# Patient Record
Sex: Female | Born: 1995 | Race: White | Hispanic: No | Marital: Married | Smoking: Never smoker
Health system: Southern US, Community
[De-identification: ages and names within clinical notes are randomized; demographics above are authoritative.]

## PROBLEM LIST (undated history)

## (undated) DIAGNOSIS — Z789 Other specified health status: Secondary | ICD-10-CM

## (undated) DIAGNOSIS — Z30430 Encounter for insertion of intrauterine contraceptive device: Secondary | ICD-10-CM

## (undated) HISTORY — PX: OTHER SURGICAL HISTORY: SHX169

## (undated) HISTORY — DX: Encounter for insertion of intrauterine contraceptive device: Z30.430

---

## 2020-04-01 NOTE — L&D Delivery Note (Signed)
OB/GYN Faculty Practice Delivery Note  Nicole Morse is a 25 y.o. [id number] s/p NSVD at [redacted]w[redacted]d. She was admitted for IOL due to post dates.   ROM: 3h 7m with clear fluid GBS Status: Negative   Delivery Date/Time: 12/29/20 at 1617  Delivery: Called to room and patient was complete and pushing. Head delivered LOA. Nuchal cord present and easily reduced at the perineum. Shoulder and body delivered in usual fashion. Infant with spontaneous cry, placed on mother's abdomen, dried and stimulated. Cord clamped x 2 after 1-minute delay, and cut by FOB under direct supervision. Cord blood drawn. Placenta delivered spontaneously with gentle cord traction. Fundus firm with massage and Pitocin. Labia, perineum, vagina, and cervix were inspected, and patient was found to have no lacerations.   Placenta: Intact, 3VC - sent to L&D Complications: None Lacerations: None  EBL: 125 cc Analgesia: Epidural   Infant: Viable female  APGARs 9 and 9  Post-Placental IUD Insertion Procedure Note  Patient identified, informed consent signed prior to delivery, signed copy in chart, time out was performed.    Vaginal, labial and perineal areas thoroughly inspected for lacerations. No lacerations were noted.   - IUD grasped between sterile gloved fingers. Fundus identified through abdominal wall using non-insertion hand. IUD inserted to fundus with bimanual technique. IUD carefully released at the fundus and insertion hand gently removed from vagina.    Strings trimmed to the level of the introitus. Patient tolerated procedure well.  Lot # [id number] Expiration Date: 12/31/2023  Patient given post procedure instructions and IUD care card with expiration date.  Patient was asked to keep IUD strings tucked in her vagina until her postpartum follow up visit in 4-6 weeks. Patient advised to abstain from sexual intercourse and pulling on strings before her follow-up visit. Patient verbalized an understanding of the plan  of care and agrees.   [name], MD OB/GYN Fellow, Faculty Practice

## 2020-04-04 ENCOUNTER — Ambulatory Visit (INDEPENDENT_AMBULATORY_CARE_PROVIDER_SITE_OTHER): Payer: Medicaid Other | Admitting: Family Medicine

## 2020-04-04 ENCOUNTER — Other Ambulatory Visit: Payer: Self-pay

## 2020-04-04 ENCOUNTER — Other Ambulatory Visit: Payer: Self-pay | Admitting: Family Medicine

## 2020-04-04 ENCOUNTER — Encounter: Payer: Self-pay | Admitting: Family Medicine

## 2020-04-04 VITALS — BP 98/66 | HR 92 | Ht 65.5 in | Wt 133.4 lb

## 2020-04-04 DIAGNOSIS — Z0289 Encounter for other administrative examinations: Secondary | ICD-10-CM | POA: Diagnosis not present

## 2020-04-04 DIAGNOSIS — K0889 Other specified disorders of teeth and supporting structures: Secondary | ICD-10-CM | POA: Diagnosis not present

## 2020-04-04 LAB — POCT URINE PREGNANCY: Preg Test, Ur: NEGATIVE

## 2020-04-04 MED ORDER — BENZOCAINE 10 % MT GEL: 1.0000 "application " | Freq: Three times a day (TID) | OROMUCOSAL | 3 refills | Status: DC | PRN

## 2020-04-04 MED ORDER — IVERMECTIN 3 MG PO TABS: 200.0000 ug/kg | ORAL_TABLET | Freq: Every day | ORAL | 0 refills | Status: DC

## 2020-04-04 MED ORDER — ALBENDAZOLE 200 MG PO TABS: 400.0000 mg | ORAL_TABLET | Freq: Once | ORAL | 0 refills | Status: AC

## 2020-04-04 NOTE — Patient Instructions (Signed)
It was wonderful to see you today.  Please bring ALL of your medications with you to every visit.   Today we talked about:  ---Going to the lab    Thank you for choosing [facility].   Please call [phone] with any questions about today's appointment.  Please be sure to schedule follow up at the front  desk before you leave today.   Terisa Starr, MD  Family Medicine
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
## 2020-04-04 NOTE — Assessment & Plan Note (Addendum)
Routine labs today, including UA and urine pregnancy test (negative).  Follow-up appointment for health concerns on April 17, 2020.  No vaccines indicated today. Resources offered for mental health concerns today, patient declines at this time.

## 2020-04-05 LAB — TSH: TSH: 0.698 u[IU]/mL (ref 0.450–4.500)

## 2020-04-05 LAB — HEPATITIS B CORE ANTIBODY, TOTAL: Hep B Core Total Ab: NEGATIVE

## 2020-04-05 LAB — HIV ANTIBODY (ROUTINE TESTING W REFLEX): HIV Screen 4th Generation wRfx: NONREACTIVE

## 2020-04-05 LAB — COMPREHENSIVE METABOLIC PANEL
ALT: 25 IU/L (ref 0–32)
AST: 18 IU/L (ref 0–40)
Albumin/Globulin Ratio: 1.6 (ref 1.2–2.2)
Albumin: 4.6 g/dL (ref 3.9–5.0)
Alkaline Phosphatase: 62 IU/L (ref 44–121)
BUN/Creatinine Ratio: 24 — ABNORMAL HIGH (ref 9–23)
BUN: 12 mg/dL (ref 6–20)
Bilirubin Total: 0.3 mg/dL (ref 0.0–1.2)
CO2: 21 mmol/L (ref 20–29)
Calcium: 9.5 mg/dL (ref 8.7–10.2)
Chloride: 103 mmol/L (ref 96–106)
Creatinine, Ser: 0.49 mg/dL — ABNORMAL LOW (ref 0.57–1.00)
GFR calc Af Amer: 157 mL/min/{1.73_m2} (ref >59)
GFR calc non Af Amer: 136 mL/min/{1.73_m2} (ref >59)
Globulin, Total: 2.8 g/dL (ref 1.5–4.5)
Glucose: 87 mg/dL (ref 65–99)
Potassium: 4.3 mmol/L (ref 3.5–5.2)
Sodium: 137 mmol/L (ref 134–144)
Total Protein: 7.4 g/dL (ref 6.0–8.5)

## 2020-04-05 LAB — CBC WITH DIFFERENTIAL/PLATELET
Basophils Absolute: 0 10*3/uL (ref 0.0–0.2)
Basos: 1 %
EOS (ABSOLUTE): 0.4 10*3/uL (ref 0.0–0.4)
Eos: 5 %
Hematocrit: 38.8 % (ref 34.0–46.6)
Hemoglobin: 13.5 g/dL (ref 11.1–15.9)
Immature Grans (Abs): 0 10*3/uL (ref 0.0–0.1)
Immature Granulocytes: 0 %
Lymphocytes Absolute: 3.2 10*3/uL — ABNORMAL HIGH (ref 0.7–3.1)
Lymphs: 38 %
MCH: 30.1 pg (ref 26.6–33.0)
MCHC: 34.8 g/dL (ref 31.5–35.7)
MCV: 87 fL (ref 79–97)
Monocytes Absolute: 0.7 10*3/uL (ref 0.1–0.9)
Monocytes: 8 %
Neutrophils Absolute: 4.2 10*3/uL (ref 1.4–7.0)
Neutrophils: 48 %
Platelets: 217 10*3/uL (ref 150–450)
RBC: 4.48 x10E6/uL (ref 3.77–5.28)
RDW: 11.8 % (ref 11.7–15.4)
WBC: 8.6 10*3/uL (ref 3.4–10.8)

## 2020-04-05 LAB — LIPID PANEL
Chol/HDL Ratio: 3.3 ratio (ref 0.0–4.4)
Cholesterol, Total: 193 mg/dL (ref 100–199)
HDL: 58 mg/dL (ref >39)
LDL Chol Calc (NIH): 115 mg/dL — ABNORMAL HIGH (ref 0–99)
Triglycerides: 113 mg/dL (ref 0–149)
VLDL Cholesterol Cal: 20 mg/dL (ref 5–40)

## 2020-04-05 LAB — RPR: RPR Ser Ql: NONREACTIVE

## 2020-04-05 LAB — HCV INTERPRETATION

## 2020-04-05 LAB — VARICELLA ZOSTER ANTIBODY, IGG: Varicella zoster IgG: 1191 index (ref >165)

## 2020-04-05 LAB — HCV AB W REFLEX TO QUANT PCR: HCV Ab: <0.1 s/co ratio (ref 0.0–0.9)

## 2020-04-05 LAB — HEPATITIS B SURFACE ANTIBODY, QUANTITATIVE: Hepatitis B Surf Ab Quant: <3.1 m[IU]/mL — ABNORMAL LOW (ref >9.9)

## 2020-04-05 LAB — HEPATITIS B SURFACE ANTIGEN: Hepatitis B Surface Ag: NEGATIVE

## 2020-04-06 LAB — HGB FRACTIONATION CASCADE
Hgb A2: 2.8 % (ref 1.8–3.2)
Hgb A: 97.2 % (ref 96.4–98.8)
Hgb F: 0 % (ref 0.0–2.0)
Hgb S: 0 %

## 2020-04-07 ENCOUNTER — Ambulatory Visit: Payer: Medicaid Other | Admitting: Family Medicine

## 2020-04-07 ENCOUNTER — Telehealth: Payer: Self-pay | Admitting: Family Medicine

## 2020-04-07 NOTE — Telephone Encounter (Signed)
Attempted to call X1. No Pashto interpreter available. Will try again next week.  Terisa Starr, MD  Family Medicine Teaching Service

## 2020-04-12 ENCOUNTER — Encounter: Payer: Self-pay | Admitting: Family Medicine

## 2020-04-17 ENCOUNTER — Ambulatory Visit: Payer: Medicaid Other | Admitting: Family Medicine

## 2020-04-21 ENCOUNTER — Other Ambulatory Visit: Payer: Self-pay

## 2020-04-21 ENCOUNTER — Other Ambulatory Visit (HOSPITAL_COMMUNITY)
Admission: RE | Admit: 2020-04-21 | Discharge: 2020-04-21 | Disposition: A | Discharge status: Home / Self Care | Payer: Medicaid Other | Source: Ambulatory Visit | Attending: Family Medicine | Admitting: Family Medicine

## 2020-04-21 ENCOUNTER — Other Ambulatory Visit (HOSPITAL_COMMUNITY): Payer: Self-pay | Admitting: Family Medicine

## 2020-04-21 ENCOUNTER — Ambulatory Visit (INDEPENDENT_AMBULATORY_CARE_PROVIDER_SITE_OTHER): Payer: Medicaid Other | Admitting: Family Medicine

## 2020-04-21 VITALS — BP 98/60 | HR 98 | Ht 65.5 in | Wt 136.2 lb

## 2020-04-21 DIAGNOSIS — Z3200 Encounter for pregnancy test, result unknown: Secondary | ICD-10-CM | POA: Diagnosis not present

## 2020-04-21 DIAGNOSIS — R109 Unspecified abdominal pain: Secondary | ICD-10-CM | POA: Diagnosis not present

## 2020-04-21 DIAGNOSIS — R1033 Periumbilical pain: Secondary | ICD-10-CM

## 2020-04-21 DIAGNOSIS — K0889 Other specified disorders of teeth and supporting structures: Secondary | ICD-10-CM | POA: Insufficient documentation

## 2020-04-21 DIAGNOSIS — Z124 Encounter for screening for malignant neoplasm of cervix: Secondary | ICD-10-CM | POA: Insufficient documentation

## 2020-04-21 DIAGNOSIS — Z3490 Encounter for supervision of normal pregnancy, unspecified, unspecified trimester: Secondary | ICD-10-CM

## 2020-04-21 DIAGNOSIS — N898 Other specified noninflammatory disorders of vagina: Secondary | ICD-10-CM

## 2020-04-21 DIAGNOSIS — Z0000 Encounter for general adult medical examination without abnormal findings: Secondary | ICD-10-CM

## 2020-04-21 DIAGNOSIS — Z01419 Encounter for gynecological examination (general) (routine) without abnormal findings: Secondary | ICD-10-CM | POA: Diagnosis not present

## 2020-04-21 LAB — POCT URINALYSIS DIP (CLINITEK)
Bilirubin, UA: NEGATIVE
Blood, UA: NEGATIVE
Glucose, UA: NEGATIVE mg/dL
Ketones, POC UA: NEGATIVE mg/dL
Leukocytes, UA: NEGATIVE
Nitrite, UA: NEGATIVE
POC PROTEIN,UA: NEGATIVE
Spec Grav, UA: 1.025 (ref 1.010–1.025)
Urobilinogen, UA: 0.2 E.U./dL
pH, UA: 6 (ref 5.0–8.0)

## 2020-04-21 LAB — POCT WET PREP (WET MOUNT)
Clue Cells Wet Prep Whiff POC: NEGATIVE
Trichomonas Wet Prep HPF POC: ABSENT

## 2020-04-21 LAB — POCT URINE PREGNANCY: Preg Test, Ur: POSITIVE — AB

## 2020-04-21 MED ORDER — PRENATAL + COMPLETE MULTI 0.267 & 373 MG PO THPK: 1.0000 | PACK | Freq: Every day | ORAL | 11 refills | Status: DC

## 2020-04-21 NOTE — Assessment & Plan Note (Signed)
Patient with no h/o renal problems, will obtain UA an BMP for reassurance as patient requests this despite counseling. Suspect physiologic aches/pains as patient is busy mother of 4 and mostly pain occurs when picking up 31 month old infant. Less likely to be renal source given bilateral hx. - UA reassuring, no abnormality - f/u BMP

## 2020-04-21 NOTE — Assessment & Plan Note (Signed)
Recently chipped tooth and senstivity to cold/hot, hard foods. Dental list given.

## 2020-04-21 NOTE — Patient Instructions (Addendum)
It was a pleasure to see you today!  1. We will get some labs today.  If they are abnormal or we need to do something about them, I will call you.  If they are normal, I will send you a message on MyChart (if it is active) or a letter in the mail.  If you don't hear from [place] in 2 weeks, please call the office  [phone].  2. You have an umbilical hernia, if you ever have sharp pain in your stomach, a lump at your belly button that you cannot massage back in, please go to the emergency department (hospital) immediately.  3. Please follow up on to discuss abdominal pain more.  4. I have prescribed prenatal vitamins for you, please take once a day   Be Well,  Dr. Leary Roca    ?!  1. ?   ? ???   ~?. ~ ? ?   ? ?  ?  ? ? ? ~  ??      ? . ~ ?        MyChart (~   ) ?  ???~ ~? ? ?~ . ~   2 ? ~? ? ?    ~?  [phone]  ? .  2.   ??~?~ ??  ~  ~    ~? ?     ?  ?? ~? ? ?? ?   ~? ?  ~?  ~?  ??? ??? ()  ? .  3.  ~? ? ~? ?  ?   ?   ~?.  4.     ??  ??? ?? ~?   ~?  ? ~? ? ?    ?   ?~? ?   Umbilical Hernia, Adult  A hernia is a bulge of tissue that pushes through an opening between muscles. An umbilical hernia happens in the abdomen, near the belly button (umbilicus). The hernia may contain tissues from the small intestine, large intestine, or fatty tissue covering the intestines (omentum). Umbilical hernias in adults tend to get worse over time, and they require surgical treatment. There are several types of umbilical hernias. You may have:  A hernia located just above or below the umbilicus (indirect  hernia). This is the most common type of umbilical hernia in adults.  A hernia that forms through an opening formed by the umbilicus (direct hernia).  A hernia that comes and goes (reducible hernia). A reducible hernia may be visible only when you strain, lift something heavy, or cough. This type of hernia can be pushed back into the abdomen (reduced).  A hernia that traps abdominal tissue inside the hernia (incarcerated hernia). This type of hernia cannot be reduced.  A hernia that cuts off blood flow to the tissues inside the hernia (strangulated hernia). The tissues can start to die if this happens. This type of hernia requires emergency treatment. What are the causes? An umbilical hernia happens when tissue inside the abdomen presses on a weak area of the abdominal muscles. What increases the risk? You may have a greater risk of this condition if you:  Are obese.  Have had several pregnancies.  Have a buildup of fluid inside your abdomen (ascites).  Have had surgery that weakens the abdominal muscles. What are the signs or symptoms? The main symptom of this condition is a painless bulge at or near the belly button. A reducible hernia may be visible only when you strain, lift something heavy, or  cough. Other symptoms may include:  Dull pain.  A feeling of pressure. Symptoms of a strangulated hernia may include:  Pain that gets increasingly worse.  Nausea and vomiting.  Pain when pressing on the hernia.  Skin over the hernia becoming red or purple.  Constipation.  Blood in the stool. How is this diagnosed? This condition may be diagnosed based on:  A physical exam. You may be asked to cough or strain while standing. These actions increase the pressure inside your abdomen and force the hernia through the opening in your muscles. Your health care provider may try to reduce the hernia by pressing on it.  Your symptoms and medical history. How is this treated? Surgery is  the only treatment for an umbilical hernia. Surgery for a strangulated hernia is done as soon as possible. If you have a small hernia that is not incarcerated, you may need to lose weight before having surgery. Follow these instructions at home:  Lose weight, if told by your health care provider.  Do not try to push the hernia back in.  Watch your hernia for any changes in color or size. Tell your health care provider if any changes occur.  You may need to avoid activities that increase pressure on your hernia.  Do not lift anything that is heavier than 10 lb (4.5 kg) until your health care provider says that this is safe.  Take over-the-counter and prescription medicines only as told by your health care provider.  Keep all follow-up visits as told by your health care provider. This is important. Contact a health care provider if:  Your hernia gets larger.  Your hernia becomes painful. Get help right away if:  You develop sudden, severe pain near the area of your hernia.  You have pain as well as nausea or vomiting.  You have pain and the skin over your hernia changes color.  You develop a fever. This information is not intended to replace advice given to you by your health care provider. Make sure you discuss any questions you have with your health care provider. Document Revised: 04/30/2017 Document Reviewed: 09/16/2016 Elsevier Patient Education  2021 [employer].

## 2020-04-21 NOTE — Assessment & Plan Note (Signed)
Pap smear completed today. 

## 2020-04-21 NOTE — Assessment & Plan Note (Signed)
Patient reports about a week of white vaginal d/c without itching, dysuria, frequency. Wet prep completed today with no abnormalities. Suspect discharge is physiologic and likely increased from baseline as patient was diagnosed with pregnancy today. LMP about 3 weeks ago, period is not due for another couple of days.

## 2020-04-21 NOTE — Assessment & Plan Note (Signed)
Patient has multiple types of abdominal pain including sharp periumbilical pain and deep, aching pain that improves with BMs. - sharp periumbilical pain: patient has a small umbilical hernia, easily reducible, likely cause of sharp pain. Educated on umbilical hernias, how to reduce them, and when to seek emergent care in case of incarceration, see AVS. Patient does not wish to have surgery at this time. - aching abdominal pain: suspect cramps related to defecation as they improve with BMs and her BMs are normal, no h/o diarrhea or constipation. Can consider other causes such as h. Pylori, recommend further elucidation at future visit, but exam is benign.

## 2020-04-21 NOTE — Assessment & Plan Note (Addendum)
Urine pregnancy test positive today, patient wishes to proceed with pregnancy. Scheduled for initial OB at next visit on 05/04/20. Prenatal vitamins sent to pharmacy today. From patient's recent refugee visit, she has already completed much of lab work needed for early pregnancy: Hgb WNL, Hep B s ag negative, HIV NR, Hep C NR, RPR NR, Hgb electrophoresis WNL, Varicella immune. Patient had MMR in September 2021. Pap smear performed today, will add on GC/CT. Only item left to do for initial OB labs is OB urine culture.  Counseled to avoid tobacco, EtOH, unpasteurized dairy products, mercury-containing sea food, cat litter, and only eat deli-meat if first heated to steaming. [place] for tylenol.

## 2020-04-21 NOTE — Progress Notes (Signed)
SUBJECTIVE:   CHIEF COMPLAINT / HPI: see below  Abdominal and flank pain: reports pain with walking in bilateral flanks, pain on right side when picking up 10 mo infant, occasional sharp pain at her umbilicus, and deep aching/cramping pain that happens occasionally. She has bowel movements 1-2x per day, soft. Pain is resolved after BM. Denies n/v/d. She reports concern for her kidneys due to flank pain and would like testing. No h/o kidney stones or other kidney pathology. Patient reports that she is concerned about the size of her abdomen, she feels her belly has not returned to the size it was prior to her last pregnancy. She previously has had her abdomen return to flat after her three pregnancies prior.  Vaginal discharge: reports white discharge, no itching on odor. Last menstrual cycle 3 weeks ago. Denies dysuria, frequency, some stress incontinence. 4 prior SVDs, no complications, one child born at home, 3 born in a hospital. She has never had a pap smear before. Discussed birth control: patient and husband wish to do NFP. Patient expressed that she didn't think she could get pregnant while she is breastfeeding a 39 month old infant. We discussed that she can get pregnant during this time period.   Tooth pain: patient reports tooth pain with cold and hot, has cavities, requests dental referral.  Pashto interpreter used throughout visit  PERTINENT  PMH / [place]: [place]i refugee  OBJECTIVE:   BP 98/60   Pulse 98   Ht 5' 5.5" (1.664 m)   Wt 136 lb 3.2 oz (61.8 kg)   SpO2 99%   BMI 22.32 kg/m   Nursing note and vitals reviewed GEN: age-appropriate Afghani woman resting comfortably in chair, NAD, WNWD HEENT: NCAT. PERRLA. Sclera without injection or icterus. MMM.  Neck: Supple. Cardiac: Regular rate and rhythm. Normal S1/S2. No murmurs, rubs, or gallops appreciated. 2+ radial pulses. Lungs: Clear bilaterally to ascultation. No increased WOB, no accessory muscle usage. No  w/r/r. Abdomen: Normoactive bowel sounds. Soft, ND, NT. Small umbilical hernia, about 5.4YH. No guarding or rebound. No CVA tenderness. PELVIC:  Normal appearing external female genitalia, normal vaginal epithelium, no abnormal discharge. Normal appearing cervix.  Neuro: Alert and at baseline Ext: no edema Psych: Pleasant and appropriate   ASSESSMENT/PLAN:   Pregnancy at early stage Urine pregnancy test positive today, patient wishes to proceed with pregnancy. Scheduled for initial OB at next visit on 05/04/20. Prenatal vitamins sent to pharmacy today. From patient's recent refugee visit, she has already completed much of lab work needed for early pregnancy: Hgb WNL, Hep B s ag negative, HIV NR, Hep C NR, RPR NR, Hgb electrophoresis WNL, Varicella immune. Patient had MMR in September 2021. Pap smear performed today, will add on GC/CT. Only item left to do for initial OB labs is OB urine culture.  Counseled to avoid tobacco, EtOH, unpasteurized dairy products, mercury-containing sea food, cat litter, and only eat deli-meat if first heated to steaming. [place] for tylenol.  Healthcare maintenance Pap smear completed today  Vaginal discharge Patient reports about a week of white vaginal d/c without itching, dysuria, frequency. Wet prep completed today with no abnormalities. Suspect discharge is physiologic and likely increased from baseline as patient was diagnosed with pregnancy today. LMP about 3 weeks ago, period is not due for another couple of days.   Abdominal pain Patient has multiple types of abdominal pain including sharp periumbilical pain and deep, aching pain that improves with BMs. - sharp periumbilical pain: patient has a small  umbilical hernia, easily reducible, likely cause of sharp pain. Educated on umbilical hernias, how to reduce them, and when to seek emergent care in case of incarceration, see AVS. Patient does not wish to have surgery at this time. - aching abdominal pain: suspect  cramps related to defecation as they improve with BMs and her BMs are normal, no h/o diarrhea or constipation. Can consider other causes such as h. Pylori, recommend further elucidation at future visit, but exam is benign.  Flank pain Patient with no h/o renal problems, will obtain UA an BMP for reassurance as patient requests this despite counseling. Suspect physiologic aches/pains as patient is busy mother of 4 and mostly pain occurs when picking up 54 month old infant. Less likely to be renal source given bilateral hx. - UA reassuring, no abnormality - f/u BMP  Tooth pain Recently chipped tooth and senstivity to cold/hot, hard foods. Dental list given.     Gladys Damme, MD [facility]

## 2020-04-22 LAB — BASIC METABOLIC PANEL
BUN/Creatinine Ratio: 15 (ref 9–23)
BUN: 8 mg/dL (ref 6–20)
CO2: 22 mmol/L (ref 20–29)
Calcium: 9.2 mg/dL (ref 8.7–10.2)
Chloride: 102 mmol/L (ref 96–106)
Creatinine, Ser: 0.53 mg/dL — ABNORMAL LOW (ref 0.57–1.00)
GFR calc Af Amer: 152 mL/min/{1.73_m2} (ref >59)
GFR calc non Af Amer: 131 mL/min/{1.73_m2} (ref >59)
Glucose: 80 mg/dL (ref 65–99)
Potassium: 4.3 mmol/L (ref 3.5–5.2)
Sodium: 136 mmol/L (ref 134–144)

## 2020-04-25 LAB — CYTOLOGY - PAP: Diagnosis: NEGATIVE

## 2020-04-28 ENCOUNTER — Encounter: Payer: Self-pay | Admitting: Family Medicine

## 2020-05-01 ENCOUNTER — Ambulatory Visit: Payer: Medicaid Other | Admitting: Family Medicine

## 2020-05-04 ENCOUNTER — Other Ambulatory Visit: Payer: Self-pay

## 2020-05-04 ENCOUNTER — Encounter: Payer: Self-pay | Admitting: Family Medicine

## 2020-05-04 ENCOUNTER — Other Ambulatory Visit (HOSPITAL_COMMUNITY): Payer: Self-pay | Admitting: Family Medicine

## 2020-05-04 ENCOUNTER — Ambulatory Visit (INDEPENDENT_AMBULATORY_CARE_PROVIDER_SITE_OTHER): Payer: Medicaid Other | Admitting: Family Medicine

## 2020-05-04 ENCOUNTER — Telehealth: Payer: Self-pay | Admitting: *Deleted

## 2020-05-04 VITALS — BP 70/48 | HR 84 | Wt 138.0 lb

## 2020-05-04 DIAGNOSIS — Z0289 Encounter for other administrative examinations: Secondary | ICD-10-CM | POA: Diagnosis not present

## 2020-05-04 DIAGNOSIS — K0889 Other specified disorders of teeth and supporting structures: Secondary | ICD-10-CM

## 2020-05-04 DIAGNOSIS — Z3490 Encounter for supervision of normal pregnancy, unspecified, unspecified trimester: Secondary | ICD-10-CM | POA: Diagnosis not present

## 2020-05-04 DIAGNOSIS — R10A0 Flank pain, unspecified side: Secondary | ICD-10-CM

## 2020-05-04 DIAGNOSIS — Z01419 Encounter for gynecological examination (general) (routine) without abnormal findings: Secondary | ICD-10-CM

## 2020-05-04 DIAGNOSIS — R109 Unspecified abdominal pain: Secondary | ICD-10-CM

## 2020-05-04 DIAGNOSIS — R1013 Epigastric pain: Secondary | ICD-10-CM | POA: Diagnosis not present

## 2020-05-04 MED ORDER — ACETAMINOPHEN ER 650 MG PO TBCR: 650.0000 mg | EXTENDED_RELEASE_TABLET | Freq: Three times a day (TID) | ORAL | 3 refills | Status: DC | PRN

## 2020-05-04 MED ORDER — BENZOCAINE 10 % MT GEL: 1.0000 "application " | Freq: Three times a day (TID) | OROMUCOSAL | 3 refills | Status: DC | PRN

## 2020-05-04 MED ORDER — PRENATAL + COMPLETE MULTI 0.267 & 373 MG PO THPK: 1.0000 | PACK | Freq: Every day | ORAL | 11 refills | Status: DC

## 2020-05-04 NOTE — Patient Instructions (Addendum)
It was wonderful to see you today. Thank you for allowing me to be a part of your care. Below is a short summary of what we discussed at your visit today:    ? ?  ?  ?  ?.  ?    ~? ?   ?  . ?   ? ?? ? ? ?    ? ~?  ~?:  Pregnancy Care  ??   Most of the testing and vaccinations we do in early pregnancy have already been done for you as part of your immigrant health testing.  The only thing left to do today is a urine culture.  Today we obtained a urine sample from you.  If the results are normal or negative, we will send you letter.  If results are positive, I will give you a call. ??? ??  ~? ? ? ??  ??  ?? ~? ~       ~?? ? ?? ?  ?  . ???? ? ?  ?  ?  ??? ~ ?.     ?     ~?. ~ ??  ?   ?    ?~ . ~ ?? ?      ? ~?.  The next doctor's appointment for both you and your husband is Thursday, February 24 at 1:45 PM.  It will be in this same clinic.  Please arrive around 1:30pm.     ?? ?   ?~? ~?   ?  ?    24  ??  1:45  ?.    ? ~??~ ~? .  ~?  ??  1:30  . The next doctor's appointment for both you and your husband is Thursday 05/25/2020 at 1:45 in the afternoon.  It will be in this same clinic.  Please arrive around 1:30pm.     ?? ?   ?~? ~?   ?  ? 05/25/2020  ??  1:45  ?.    ? ~??~ ~? .  ~?  ??  1:30  .Marland Kitchen  Tooth pain  ?  I have sent 2 prescriptions to the pharmacy.  I have sent in Orajel and Tylenol.  The directions are below.  I will also work on connecting you with the dentist that takes your  insurance.  This will take several days to coordinate, but I will reach out to you with the information once I have completed it.    2 ? ?? .   ?  ??? ~? ??? ?. ?? ? .     ? ?~?   ??~ ~?  ~ ~? ?  ? .   ? ??  ? ? ?  ?      ~? ~ ?  ? ~?.  Orajel ? This is the pain relieving gel that you will put on your tooth about 30 minutes before you eat.  It will make the skin that it touches numb, including your gums and teeth.  You can use it up to 3 times a day.  You do not need to rinse your mouth before you eat.  This medicine is safe for the mouth and is safe to ingest a little bit.    ~ ? ? ?    ? ?  30 ?? ~?  ? ~? .    ?~? ?? ~? ?      ?  ?? ?  ~.  ~?    ? ~?  3 ? ? ~.  ??   ?    ??.    ?      ? ? ?  Tylenol ??? This is a pain relieving pill.  You can take one pill every eight hours for pain as needed.  Do not take more than three pills in one day.  This pain medicine is safe for pregnancy.    ~ ?? .  ~?   ??        8  ~? 1 ?? .  ? ? ~?  3 ? ?? ??  .      ??   ?.    ~ ?? .  ~?   ??          ~? ? ?? .  ? ? ~?  ? ? ? ??  .      ??   ?.   Pharmacy  I have sent three medications to the most common outpatient pharmacy for you to pick up.  I have included a map.  It is just on the sidewalk a couple buildings over.  I have sent the medicines electronically, to the pharmacy should be able to see them on the computer.  You do not need to have a  printed prescription to take to them.  I have given you a paper that you can hand the pharmacist that explains what you need.     ?  ?        ?? .  ?   ~?.  ??  ?~ ~? ? ? ? ? .    ~?~ ? ??    ?  ~?? ~?  ? ? .  ??   ?  ?     .    ? ~ ~?? ?  ~?   ?~   ?  ? ? ~? ?   ?? .  If you have any questions or concerns, please do not hesitate to contact us via phone or MyChart message.   ~  ~ ? ? ??   ~?  ? ? MyChart ?  ? ?  ??~ ?.  Fayette Pho, MD

## 2020-05-04 NOTE — Assessment & Plan Note (Signed)
Orajel and tylenol from [facility] pharmacy using [facility] indigent fund. Given worsening pain affecting PO status and lack of coordination from sponsor thus far, I will reach out to some local dentists in an effort to find one who takes Medicaid and can utilize an interpreter.

## 2020-05-04 NOTE — Progress Notes (Signed)
    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 - No fam hx genetic issues, conditions - Previously received most early pregnancy labs with initial refugee examinations - Only lab left to do today is OB urine culture  Dental pain - Worsened from initial refugee health examination - Received list of dentists who take Medicaid at first visit; however have not had any luck yet with sponsor or support people helping them to coordinate dental appointment - Decreased PO intake of food and liquid due to pain with any temperature and chewing - Believes filling has fallen out  PERTINENT  PMH / PSH: Vaginal discharge, abdominal pain, flank pain, tooth pain  OBJECTIVE:   BP (!) 70/48   Pulse 84   Wt 138 lb (62.6 kg)   BMI 22.62 kg/m    Physical Exam General: Awake, alert, oriented, no acute distress Respiratory: Unlabored respirations, speaking in full sentences, no respiratory distress Extremities: Moving all extremities spontaneously, normal gait Neuro: Cranial nerves II through X grossly intact  ASSESSMENT/PLAN:   Pregnancy at early stage OB urine culture obtained today. Other labs collected at initial refugee examination in early January. Follow up in one month, 2/24.   Tooth pain Orajel and tylenol from [facility] using [facility] indigent fund. Given worsening pain affecting PO status and lack of coordination from sponsor thus far, I will reach out to some local dentists in an effort to find one who takes Medicaid and can utilize an interpreter.      Fayette Pho, MD [facility]

## 2020-05-04 NOTE — Telephone Encounter (Signed)
[facility] Indigent Fund Voucher:  Previous assistance?  no  What was provided? $10 for OTC [facility].  Provider:  Fayette Pho, MD  Clinic Staff:  Jone Baseman, CMA  Note routed to [facility], CMA and Sammuel Hines, LCSW to add to [employer].

## 2020-05-04 NOTE — Progress Notes (Signed)
Recheck of BP by Machine @ 2:30pm - 92/58

## 2020-05-04 NOTE — Assessment & Plan Note (Addendum)
OB urine culture obtained today. Other labs collected at initial refugee examination in early January. Follow up in one month, 2/24.

## 2020-05-06 LAB — URINE CULTURE, OB REFLEX

## 2020-05-06 LAB — CULTURE, OB URINE

## 2020-05-08 ENCOUNTER — Telehealth: Payer: Self-pay | Admitting: Family Medicine

## 2020-05-08 NOTE — Telephone Encounter (Signed)
Follow up on dental services from last week's clinic visit. At her early January visit, patient requested dental services and was provided a list of dentists in the area who take Medicaid. At last week's visit, patient relayed that her sponsor was overwhelmingly busy and had not yet been able to assist her calling and making a dental appointment. She is experiencing increasing pain due to missing fillings that is affecting her PO intake.   Today I personally called around and found a dental office that accepts Medicaid and is willing to take new patients.   I attempted to call Nicole Morse to relay the information, but a Pashto interpreter was not available. I have an appointment with the interpreter line tomorrow morning at 9am. I am also mailing a letter with the information, directions, and info sheet to hand the dentist.   Fayette Pho, MD

## 2020-05-09 ENCOUNTER — Telehealth: Payer: Self-pay | Admitting: Family Medicine

## 2020-05-09 NOTE — Telephone Encounter (Signed)
Was able to obtain Pashto interpreter to call patient. Her husband answered the phone and was able to take the message. I relayed the details of Nicole Morse's upcoming dental appointment at the dental office of Dr. Stephenie Acres on Thurs. 2/17 at 4pm. We discussed the appointment time, necessary items to bring, and the bus route to take. I also relayed that we mailed a letter to them containing all this information in print. All questions were answered.   Fayette Pho, MD

## 2020-05-12 ENCOUNTER — Telehealth: Payer: Self-pay | Admitting: Family Medicine

## 2020-05-12 NOTE — Telephone Encounter (Signed)
   Telephone encounter was:  Unsuccessful.  05/12/2020 Name: Nicole Morse MRN: [id number] DOB: 23-Oct-1995  Unsuccessful outbound call made today to assist with:  [employer] Attempt:  1st Attempt  No Pashto interpreter available at this time, could not connect with patient.  Care Guide will try to give patient a call back within the week.   [facility] Care Guide, Embedded Care Coordination [facility], Care Management Phone: [phone] Email: [email or web address]

## 2020-05-18 ENCOUNTER — Telehealth: Payer: Self-pay | Admitting: Family Medicine

## 2020-05-18 NOTE — Telephone Encounter (Signed)
   Referral Reason: Need to connect with community resources.  Interventions: Nicole Morse needed assistance with connecting with a dentist. Dr. Larita Fife was able to assist patient with connecting with a dentist that accepts Medicaid. Patient was given the resources. Care Guide will give paitent a call to see if she is still in need of resources.   Follow up plan: Care guide will follow up with patient by phone over the next week.

## 2020-05-22 ENCOUNTER — Telehealth: Payer: Self-pay | Admitting: Family Medicine

## 2020-05-22 NOTE — Telephone Encounter (Signed)
   Telephone encounter was:  Successful.  05/22/2020 Name: Danyella Mcginty MRN: [id number] DOB: [birth year]  Jaidynn Balster is a 25 y.o. year old female who is a primary care patient of Fayette Pho, MD . The community resource team was consulted for assistance with [facility] Assistance  Care guide performed the following interventions: Discussed resources to assist with patient's husband. Mr. Galer stated that his wife went to the dentist last week, but was not seen by the dentist. Mr. Binette stated that the dentist did not accept their interpreter and was told that they need to reschedule. He also stated that his wife is pregnant and is in pain. Informed Mr. Wartman that Care Guide will see if  is an interpreter service available that will be able to assist his wife and also try to see if denstist appointment can be rescheduled. Mr. Schmelzle stated understanding. .  Follow Up Plan:  Care guide will follow up with patient by phone over the next week.   [facility] Care Guide, Embedded Care Coordination [facility], Care Management Phone: [phone] Email: [email or web address]

## 2020-05-24 ENCOUNTER — Telehealth: Payer: Self-pay | Admitting: Family Medicine

## 2020-05-24 NOTE — Telephone Encounter (Signed)
   Telephone encounter was:  Successful.  05/24/2020 Name: Nicole Morse MRN: [id number] DOB: 1995-11-14  Nicole Morse is a 25 y.o. year old female who is a primary care patient of Fayette Pho, MD . The community resource team was consulted for assistance with Dental Assistance  Care guide performed the following interventions: Spoke with Selena Batten from [employer]. She stated that they do provice interpreters, but the office providing the service will need to give them a call to request the interpreter and also pay for the services. Selena Batten also stated that sometimes the immigrants sponsor will cover the cost of the interpreter. .  Follow Up Plan:  Care Guide will give patient a call to see if she knows who her Refugee sponsor is to see if they will be willing to pay for the service.   [facility] Care Guide, Embedded Care Coordination [facility], Care Management Phone: [phone] Email: [email or web address]

## 2020-05-25 ENCOUNTER — Other Ambulatory Visit: Payer: Self-pay

## 2020-05-25 ENCOUNTER — Ambulatory Visit: Payer: Medicaid Other | Admitting: Family Medicine

## 2020-05-25 ENCOUNTER — Encounter: Payer: Self-pay | Admitting: Family Medicine

## 2020-05-25 VITALS — BP 102/75 | HR 78 | Ht 65.35 in | Wt 137.8 lb

## 2020-05-25 DIAGNOSIS — Z3490 Encounter for supervision of normal pregnancy, unspecified, unspecified trimester: Secondary | ICD-10-CM

## 2020-05-25 NOTE — Patient Instructions (Signed)
It was wonderful to see you today. Thank you for allowing me to be a part of your care. Below is a short summary of what we discussed at your visit today:   ? ?  ?  ?  ?.  ?    ~? ?   ?  . ?   ? ?? ? ? ?    ? ~?  ~?:  Pregnancy  We will need an ultrasound to determine how far along you are with baby.  We will schedule the appointment with the hospital directly and then call you to let you know the details. ?  ??  ??  ?  ~? ?     ? ? ?. ?   ? ?     ?? ? ? ~?  ?    ?  ?   ? ~?.  Please bring all of your medications to every appointment!  ~?     ?  ?!  If you have any questions or concerns, please do not hesitate to contact us via phone or MyChart message.  ~  ~ ? ? ??   ~?  ? ? MyChart ?  ? ?  ??~ ?.  Fayette Pho, MD

## 2020-05-26 ENCOUNTER — Telehealth: Payer: Self-pay | Admitting: Family Medicine

## 2020-05-26 NOTE — Telephone Encounter (Signed)
   Telephone encounter was:  Successful.  05/26/2020 Name: Ercilia Bettinger MRN: [id number] DOB: [birth year]  Amiayah Giebel is a 25 y.o. year old female who is a primary care patient of Fayette Pho, MD . The community resource team was consulted for assistance with Dental Assistance  Care guide performed the following interventions: Spoke with Huntley Dec (patient's sponsor) regarding referral. Huntley Dec stated that she is aware that the patient was scheduled with Dr. Stephenie Acres for dental care but was turned away because Ms. Gaertner did not have an acceptable interpreter. Huntley Dec stated that she has called [facility] and was able to make the patient an appointment but she cannot be seen until July 03, 2020. Care Guide informed Huntley Dec that Language resources has been contacted, but a referral will need to come from the provider's office. Huntley Dec stated she is having difficulty getting assistance for Ms. Foots. Informed Huntley Dec that Care Guide will continue to do research to see if there is any interpreter assistance that can help Ms. Bulthuis. Huntley Dec stated understanding. .  Follow Up Plan:  Care Guide will follow up with Huntley Dec over the next week.   [facility] Care Guide, Embedded Care Coordination [facility], Care Management Phone: [phone] Email: [email or web address]

## 2020-05-28 NOTE — Progress Notes (Signed)
  Patient Name: Nicole Morse Date of Birth: 12-24-1995 [facility] Prenatal Visit  Nicole Morse is a 25 y.o. [id number] at Unknown gestational age here for routine follow up.   She reports fatigue, no bleeding, no contractions, no cramping and no leaking.  See flow sheet for details.  Vitals:   05/25/20 1408  BP: 102/75  Pulse: 78    Physical Exam General: Awake, alert, oriented Cardiovascular: Regular rate and rhythm, S1 and S2 present, no murmurs auscultated Respiratory: Lung fields clear to auscultation bilaterally Extremities: No bilateral lower extremity edema, palpable pedal and pretibial pulses bilaterally Neuro: Cranial nerves II through X grossly intact, able to move all extremities spontaneously   A/P: Pregnancy at Unknown gestational age.  Doing well.    1. Routine Prenatal Care:  Marland Kitchen Dating not yet available, scheduled for dating ultrasound. Patient unsure of her LMP, reports "a couple months ago". Pregnancy test negative on 04/04/2020 but positive at next visit on 04/21/2020. Marland Kitchen Fetal heart tones absent, but likely too early.  . Influenza vaccine previously administered.   Marland Kitchen COVID vaccination previously administered.  . The patient has the following indication for screening preexisting diabetes: Reviewed indications for early 1 hour glucose testing, not indicated . Marland Kitchen Anatomy ultrasound ordered to be scheduled at 18-20 weeks. . Patient has not yet been offered interested in genetic screening given early gestation and unknown gestational age.  . Pregnancy education including expected weight gain in pregnancy, OTC medication use, continued use of prenatal vitamin, smoking cessation if applicable, and nutrition in pregnancy.   . Bleeding and pain precautions reviewed.  2. Dating ultrasound scheduled.  3. Follow up in four weeks.   Fayette Pho, MD

## 2020-05-29 ENCOUNTER — Telehealth: Payer: Self-pay | Admitting: Family Medicine

## 2020-05-29 NOTE — Telephone Encounter (Signed)
° °  Referral Reason: Interpreter Assistance  Interventions: Investigation of community resources performed Called [employer] program at [employer]. Left message for Cristopher Peru to see if they provide inerpreter services  for patients.   Follow up plan: Care Guide will follow up with patient's sponsor Huntley Dec.     [facility] Care Guide, Embedded Care Coordination [facility], Care Management Phone: [phone] Email: [email or web address]

## 2020-05-30 ENCOUNTER — Telehealth: Payer: Self-pay

## 2020-05-30 NOTE — Addendum Note (Signed)
Addended by: Valetta Close on: 05/30/2020 11:13 AM   Modules accepted: Orders

## 2020-05-30 NOTE — Telephone Encounter (Signed)
Called patient using [employer] [phone].  Ultrasound OB Complete less than 14 wks [facility] [address] [place], [place] [address] 06/07/[id number] with arrival at 1245 Please arrive with full bladder Drink 32 oz water 1 hour prior to appointment Do not void  All the above information given to patient's husband.  Patient was present and heard everything.  Patient and husband verbalized understanding.  Glennie Hawk, CMA

## 2020-06-01 ENCOUNTER — Telehealth: Payer: Self-pay | Admitting: Family Medicine

## 2020-06-01 NOTE — Telephone Encounter (Signed)
   Telephone encounter was:  Unsuccessful.  06/01/2020 Name: Nicole Morse MRN: [id number] DOB: 1996/02/23  Unsuccessful outbound call made today to assist with:  Interpreter Assistance  Outreach Attempt:  2nd Attempt  A HIPAA compliant voice message was left requesting a return call.  Instructed patient to call back at [phone].  [facility] Care Guide, Embedded Care Coordination [facility], Care Management Phone: [phone] Email: [email or web address]

## 2020-06-06 ENCOUNTER — Telehealth: Payer: Self-pay | Admitting: Family Medicine

## 2020-06-06 NOTE — Telephone Encounter (Signed)
   Telephone encounter was:  Unsuccessful.  06/06/2020 Name: Nicole Morse MRN: [id number] DOB: 1995/10/31  Unsuccessful outbound call made today to assist with:  Interpreter Assistance  Outreach Attempt:  3rd Attempt.  Referral closed unable to contact patient.  Attempted to call Huntley Dec Systems developer for Mrs. Tonie Griffith) as a follow up call to see if she was able to find interpreter assistance for Mrs. Navarez, but she did not answer and could not leave a voicemail. [employer] message for Huntley Dec on 06/01/2020 letting her know that Care Guide has been unable to find interpreter assistance.   [facility] Care Guide, Embedded Care Coordination [facility], Care Management Phone: [phone] Email: [email or web address]

## 2020-06-07 ENCOUNTER — Other Ambulatory Visit: Payer: Self-pay | Admitting: Family Medicine

## 2020-06-07 ENCOUNTER — Ambulatory Visit
Admission: RE | Admit: 2020-06-07 | Discharge: 2020-06-07 | Disposition: A | Discharge status: Home / Self Care | Payer: Medicaid Other | Source: Ambulatory Visit | Attending: Family Medicine | Admitting: Family Medicine

## 2020-06-07 ENCOUNTER — Other Ambulatory Visit: Payer: Self-pay

## 2020-06-07 ENCOUNTER — Telehealth: Payer: Self-pay | Admitting: Family Medicine

## 2020-06-07 DIAGNOSIS — Z3490 Encounter for supervision of normal pregnancy, unspecified, unspecified trimester: Secondary | ICD-10-CM

## 2020-06-07 DIAGNOSIS — O219 Vomiting of pregnancy, unspecified: Secondary | ICD-10-CM

## 2020-06-07 MED ORDER — DOXYLAMINE-PYRIDOXINE 10-10 MG PO TBEC: 20.0000 mg | DELAYED_RELEASE_TABLET | Freq: Every day | ORAL | 3 refills | Status: DC

## 2020-06-07 NOTE — Telephone Encounter (Signed)
Received call from interpreter earlier today regarding nausea limiting PO intake. Called back interpreter Shakeela using number provided in phone note. Sent in script for diclegis, explained carefully through interpreter how to take and taper up diclegis as needed for nausea.   Patient also reported continued tooth pain; at the previous dentist appointment at the office of Dr. Eric Sadler she was turned away after a 2-hour bus commute because she did not have her own in-person interpreter. Of note, they had an interpreter available on the phone. Their sponsor, Sara, was able to schedule an appointment with [facility] for tomorrow at 8am. Looking forward to hearing how her dental appointment tomorrow goes.   Julius Boniface, MD  

## 2020-06-07 NOTE — Telephone Encounter (Signed)
Patient is calling with her interpreter Shakeela. Patient would like for Dr. Larita Fife to call her concerning issues she continues to have with not being able to eat and being nauseous. I suggested scheduling an appointment but the interpreter said since the patient is a refugee it is hard to make appointments.   Please call Janene Harvey back to discuss [phone]

## 2020-06-07 NOTE — Progress Notes (Signed)
Received call from interpreter earlier today regarding nausea limiting PO intake. Called back interpreter Shakeela using number provided in phone note. Sent in script for diclegis, explained carefully through interpreter how to take and taper up diclegis as needed for nausea.   Patient also reported continued tooth pain; at the previous dentist appointment at the office of Dr. Stephenie Acres she was turned away after a 2-hour bus commute because she did not have her own in-person interpreter. Of note, they had an interpreter available on the phone. Their sponsor, Huntley Dec, was able to schedule an appointment with [facility] for tomorrow at 8am. Looking forward to hearing how her dental appointment tomorrow goes.   Fayette Pho, MD

## 2020-06-08 ENCOUNTER — Telehealth: Payer: Self-pay | Admitting: Family Medicine

## 2020-06-08 NOTE — Telephone Encounter (Signed)
Called [facility] office at [phone] to discuss this patient being turned away from her dental appointment this morning at 8 am for not providing her own in-person interpreter. No one was available, I left a message with a call back number.   As far as I know, this dental office is covered under the ACA as they accept Medicaid and Medicare; thus, they are legally required to provide qualified medical interpreter services.   Will be eagerly looking forward to a call back.   Fayette Pho, MD

## 2020-06-08 NOTE — Telephone Encounter (Signed)
Called the dental office of Nicole Morse, Nicole Morse regarding [name] being turned away for not providing her own in-person interpreter at her appointment.   Called [phone], spoke with [facility] the office manager.   She looked up this patient's chart and remarked that there were no notes in there about her being turned away. She confirmed that her dental office was covered under the ACA's legal requirement to provide qualified medical interpretation services, as the office accepts Medicaid and Medicare.   She told me that she had reached out to a translation agency in January but never heard back. She says that after repeat contact, they returned her message at the beginning of March. I believe she only tried one service. She reported "the previous owner never had an interpreter service because they never needed it." She said they also had a deaf patient they had to turn away for lack of translator services.   Pam promised me she would reach back out to the translation service this afternoon. She is hoping to reach out to the patient directly and get them into the office for a dental appointment.   Fayette Pho, MD

## 2020-06-08 NOTE — Telephone Encounter (Signed)
#  First trimester pregnancy: Used interpreter service to call patient and relay the results of her obstetric ultrasound. Husband answered, patient not available. Spoke with husband only. Relayed US demonstrated single fetus at [redacted] weeks gestation. Letter also sent with ultrasound results in English and Pashto.   #Nausea: Also inquired about diclegis script sent yesterday. They will pick it up from the pharmacy this afternoon.   #Dental pain: At the dental appointment this morning, Calise was seen but could not receive treatment yet; the office told them they would need her full medical records since she was pregnant. I will call the dental office and give them instructions on how to formally request this patient's records through the proper venues.   #Sponsor coordination: I will call Maralyn Sago and attempt to relay the information regarding the problems at the dental office and facilitate Lydiann obtaining dental care.    Fayette Pho, MD

## 2020-06-20 NOTE — Telephone Encounter (Signed)
Addendum  Received voicemail from Dr. See at [facility]. She relayed that as she only takes Medicaid and not Medicare, she does not have an interpreter resource and is unaware of how to access one. She does say she is willing to see refugee patients but that her other refugee patients have another person come with them to appointments who can translate.   She adds that Ms. Sanson was turned away from dental care primarily because she is pregnant and in the first trimester. She said it was because they use dental x-rays for diagnostic purposes and as a rule she does not treat any women currently in the first trimester of pregnancy. She doesn't want to take any chances with the fetus even though the patients wear lead vests.   Call back number is [phone].   Fayette Pho, MD

## 2020-06-20 NOTE — Telephone Encounter (Signed)
Of note, Ms. Pelster measured 12.0 weeks on her first ultrasound on 3/09, the day before her dental appointment. On the day of this addendum (3/22) Ms. Aulds's working EGA is 13.6 and is now considered to be second trimester. Based on the voicemail from the dentist, Dr. See, the limiting factor to treatment and diagnostic dental x-rays was her first-trimester status. She should now be able to receive dental care as she is now in the second trimester.   Attempted to call [facility] to give them our fax number to formally request medical records and also facilitate scheduling an appointment. No option to leave voicemail. Will try tomorrow during their business hours.   Fayette Pho, MD

## 2020-06-27 ENCOUNTER — Encounter: Payer: Medicaid Other | Admitting: Family Medicine

## 2020-06-27 NOTE — Progress Notes (Signed)
  Patient Name: Nicole Morse Date of Birth: 05-23-1995 Date of Visit: 06/29/20 PCP: Ezequiel Essex, MD First Trimester Prenatal Visit   Chief Complaint: prenatal care  Subjective: Nicole Morse is a pleasant [phone] at [place] by dating ultrasound. She has no unusual complaints today.  She denies vaginal bleeding or discharge.   Nausea? No  Current Medications: Used Diclegis which helped  Endorses feeling like her blood pressure drops occasionally. She has a history of Low BP issues and dizziness in first trimester with all prior pregnancies. She has tried orange juice without improvement. She drinks 1 bottle of water a day. Denies LOC, chest pain, SOB.    ROS: Per HPI.   Objective: Vitals:   06/28/20 1437  BP: (!) 106/57  Pulse: 74   Last Weight  Most recent update: 06/28/2020  2:37 PM   Weight  61.5 kg (135 lb 9.6 oz)           Fetal HR: 147  General:  Alert, oriented and cooperative. Patient is in no acute distress.  Skin: Skin is warm and dry. No rash noted.   Cardiovascular: Normal heart rate noted  Respiratory: Normal respiratory effort, no problems with respiration noted  Abdomen: Soft, gravid, appropriate for gestational age.        Pelvic: Cervical exam deferred      VULVA: normal appearing vulva with no masses, tenderness or lesions. GC/Cl swab obtained. Cervix not visualized.  Extremities: Normal range of motion.     Mental Status: Normal mood and affect. Normal behavior. Normal judgment and thought content.    Assessment/Plan: Nicole Morse is a pleasant [phone] at [place] based on dating ultrasound presenting for routine first trimester prenatal care. Overall she is doing well.  -  Pregnancy issues include: dental pain, . - Anatomy ultrasound ordered to be scheduled at 18-19 weeks. Scheduled for 07/26/20 - Influenza vaccine previously administered.   - COVID vaccination previously administered.  - Compliant with prental vitamin - GC/Cl obtained - Needs  blood type and Antibody screen obtained - No rubella screen obtained. Consider obtaining at follow up. However, per chart review (media) she received MMR vaccin on 12/21/19  Problems: 1. Dental Pain: Dental note provided  2. Hep B Nonimmune: will need postpoartum hep B vaccination  3. Need for Pashto Interpreter: Requires female Pashto interpreter. Spoke to [email or web address] who will help arrange this for her next couple of future appointments if able. Recommend emailing when scheduling each follow up appointment to help ensure interpreter present.  4. Occasional Dizziness: BP normotensive today. Encouraged adequate hydration of 64-96oz of water per day. Family will be fasting for month of April which consists of 17 hours of fasting per day. Advised that patient pay close attention to body and to break fast if becomes lightheaded/dizzy or weak. She voiced understanding and agreement with plan.   Anticipatory Guidance and Prenatal Education provided on the following topics: - Discussed genetic testing options reviewed - Reviewed nutrition in pregnancy, avoiding cat litter, unpasteurized cheeses - Recommended Prenatal vitamin  - Discussed options for Contraception postpartum - Problem list and prenatal box updated  Follow up on 08/02/20 in OB clinic for continued prenatal care  Due to language barrier, an interpreter was present during the history-taking and subsequent discussion (and for part of the physical exam) with this patient. Pashto Interpretor present throughout exam.  Mina Marble, [place], PGY3 06/29/2020 7:19 PM

## 2020-06-28 ENCOUNTER — Other Ambulatory Visit: Payer: Self-pay

## 2020-06-28 ENCOUNTER — Ambulatory Visit (INDEPENDENT_AMBULATORY_CARE_PROVIDER_SITE_OTHER): Payer: Medicaid Other | Admitting: Family Medicine

## 2020-06-28 ENCOUNTER — Other Ambulatory Visit (HOSPITAL_COMMUNITY)
Admission: RE | Admit: 2020-06-28 | Discharge: 2020-06-28 | Disposition: A | Discharge status: Home / Self Care | Payer: Medicaid Other | Source: Ambulatory Visit | Attending: Family Medicine | Admitting: Family Medicine

## 2020-06-28 VITALS — BP 106/57 | HR 74 | Wt 135.6 lb

## 2020-06-28 DIAGNOSIS — Z3492 Encounter for supervision of normal pregnancy, unspecified, second trimester: Secondary | ICD-10-CM | POA: Insufficient documentation

## 2020-06-28 DIAGNOSIS — K0889 Other specified disorders of teeth and supporting structures: Secondary | ICD-10-CM

## 2020-06-28 NOTE — Progress Notes (Signed)
Patient's husband is insistent that patient NEVER has a female interpreter or provider.  Mr. Goodlin states that he would "rather die than to come back to [facility] unless we can guarantee a female interpreter and provider for patient".  Glennie Hawk, CMA

## 2020-06-28 NOTE — Patient Instructions (Addendum)
   ??? ?   ? ? :     1.   5 ? ? ?  ?    ? 1 ?   1-2        ?  ? ~? ? ? ?  ~?. 2.   ? ? ? ? ơ ?  ? ? ??~ ?  ??   ? ??  3.  ? ?  ? ?         - ~  ? ? ? (   ?? ~? ?     ??   ?? ??  ) 4.    ~ ?  ~ ~ ~ ?? ?  ~ 

## 2020-06-29 DIAGNOSIS — Z3490 Encounter for supervision of normal pregnancy, unspecified, unspecified trimester: Secondary | ICD-10-CM | POA: Insufficient documentation

## 2020-06-29 DIAGNOSIS — Z3492 Encounter for supervision of normal pregnancy, unspecified, second trimester: Secondary | ICD-10-CM | POA: Insufficient documentation

## 2020-06-29 HISTORY — DX: Encounter for supervision of normal pregnancy, unspecified, unspecified trimester: Z34.90

## 2020-06-30 LAB — CERVICOVAGINAL ANCILLARY ONLY
Chlamydia: NEGATIVE
Comment: NEGATIVE
Comment: NEGATIVE
Comment: NORMAL
Neisseria Gonorrhea: NEGATIVE
Trichomonas: NEGATIVE

## 2020-07-02 ENCOUNTER — Encounter: Payer: Self-pay | Admitting: Family Medicine

## 2020-07-26 ENCOUNTER — Other Ambulatory Visit: Payer: Self-pay

## 2020-07-26 ENCOUNTER — Ambulatory Visit: Payer: Medicaid Other | Attending: Family Medicine

## 2020-07-26 DIAGNOSIS — Z3492 Encounter for supervision of normal pregnancy, unspecified, second trimester: Secondary | ICD-10-CM | POA: Insufficient documentation

## 2020-07-26 IMAGING — US US MFM OB COMP +14 WKS
1 series · 14 of 28 positions shown · non-contrast
Comparison: none

[Series 1: us mfm ob comp +14 wks · 65 acquisitions, 14 frames shown]
[im 3/65]
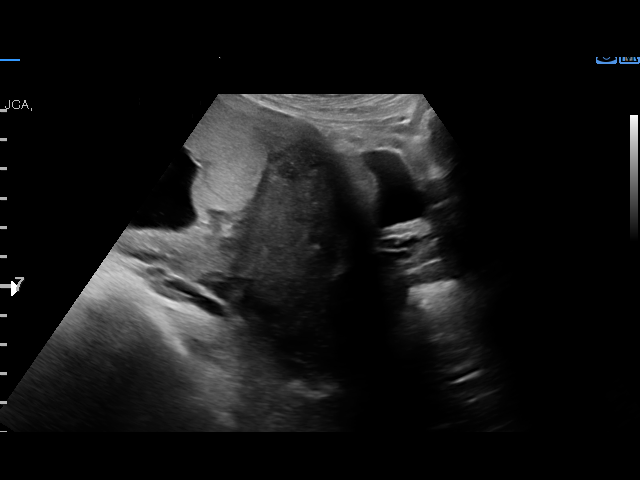
[im 8/65]
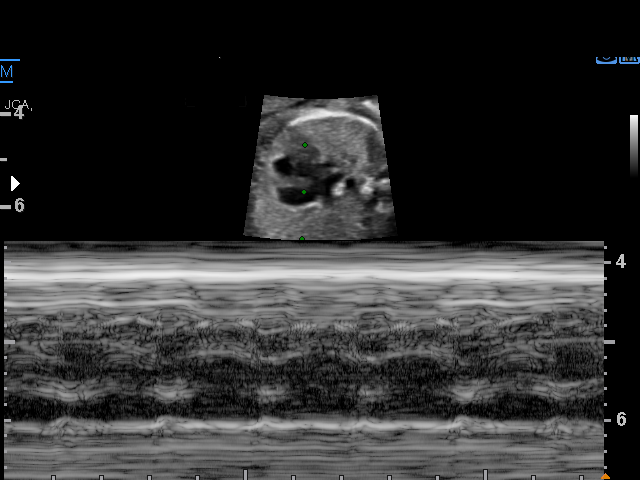
[im 12/65]
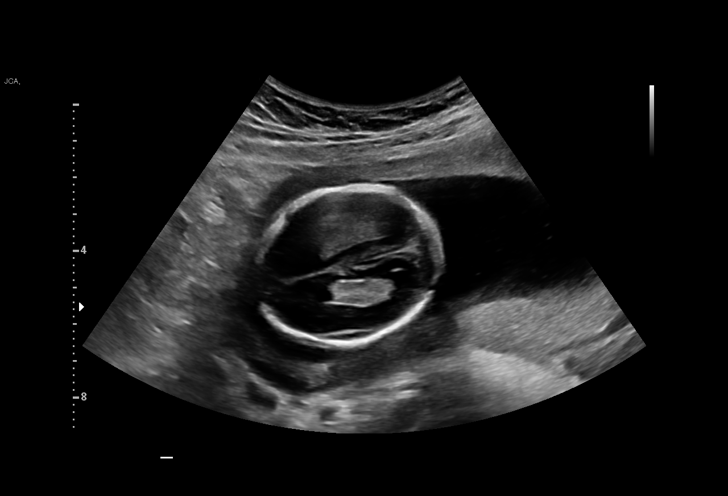
[im 17/65]
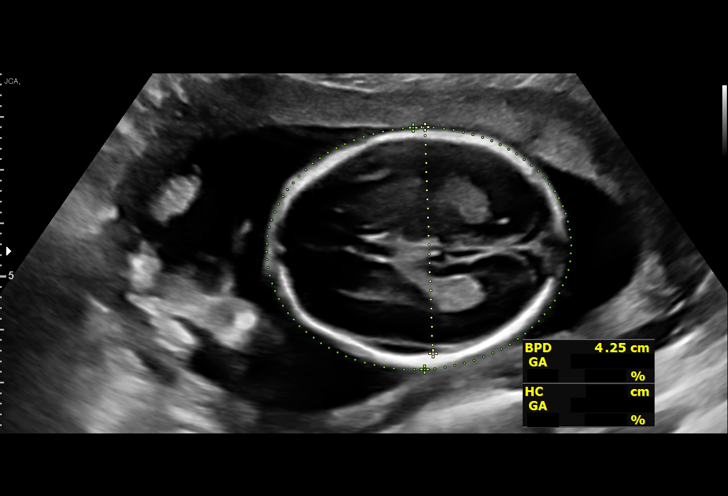
[im 22/65]
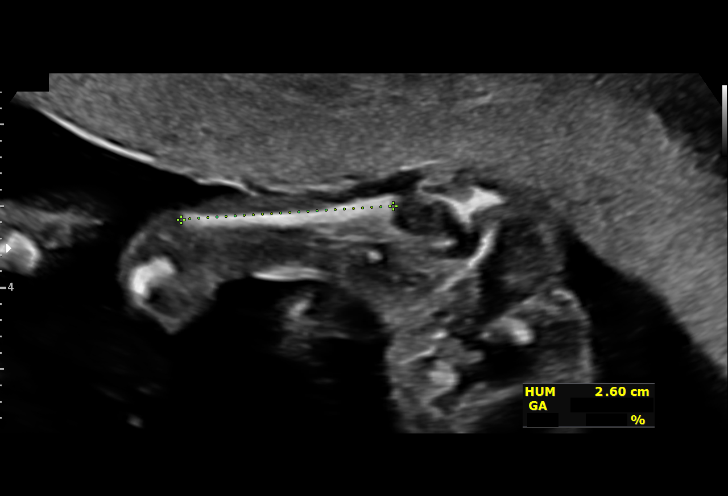
[im 27/65]
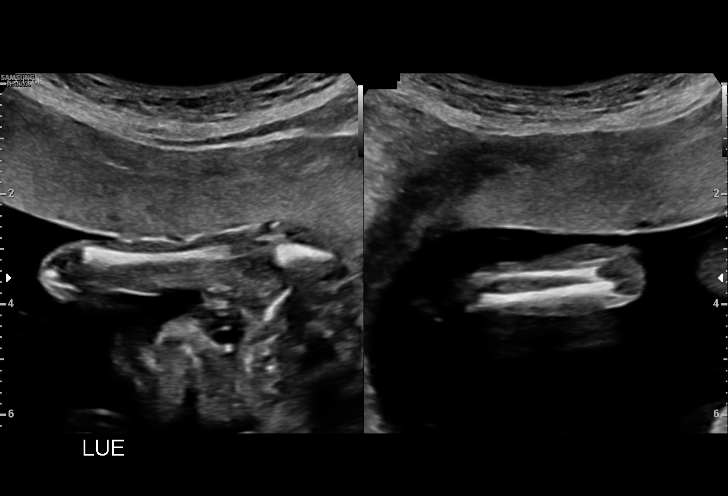
[im 31/65]
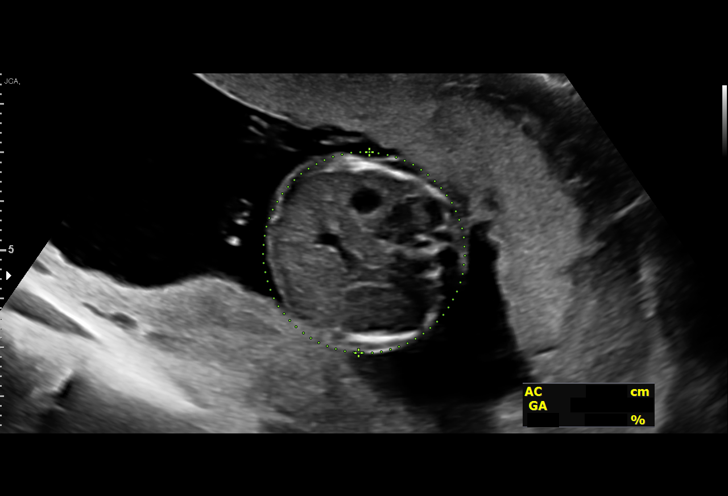
[im 36/65]
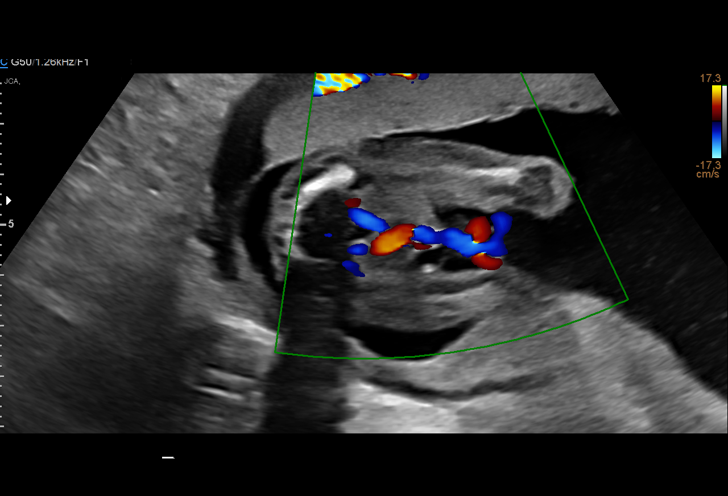
[im 41/65]
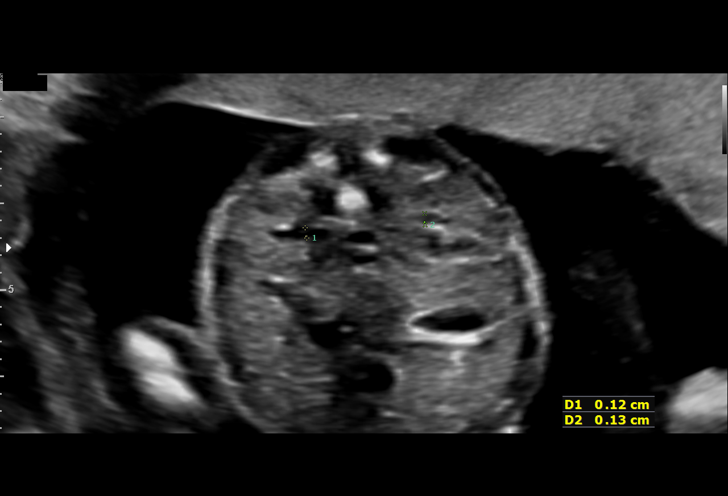
[im 46/65]
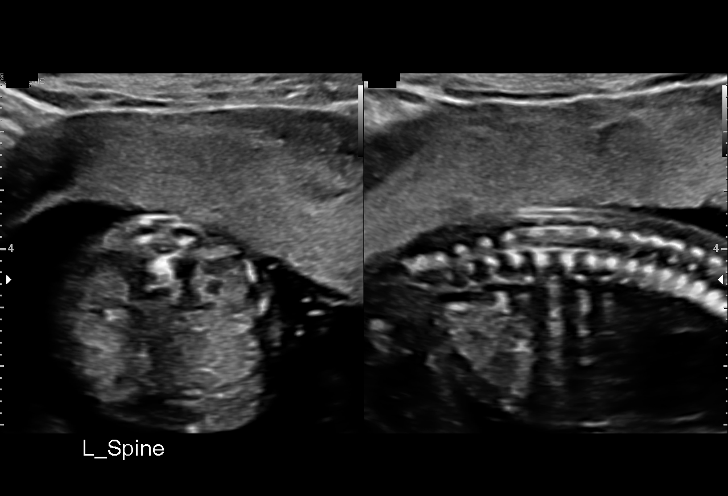
[im 50/65]
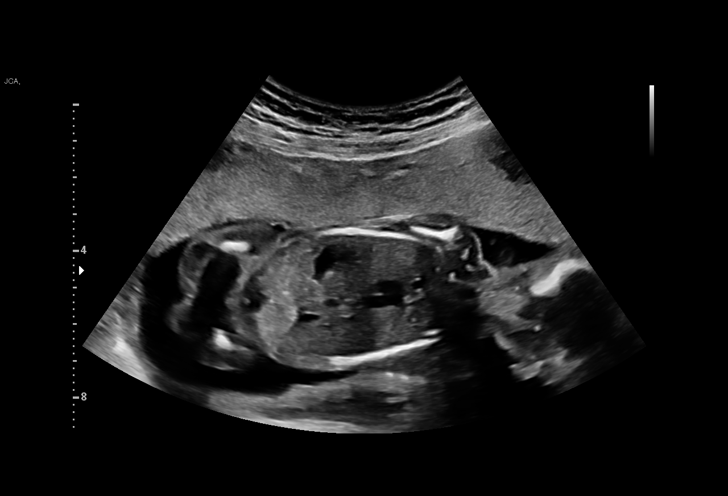
[im 55/65]
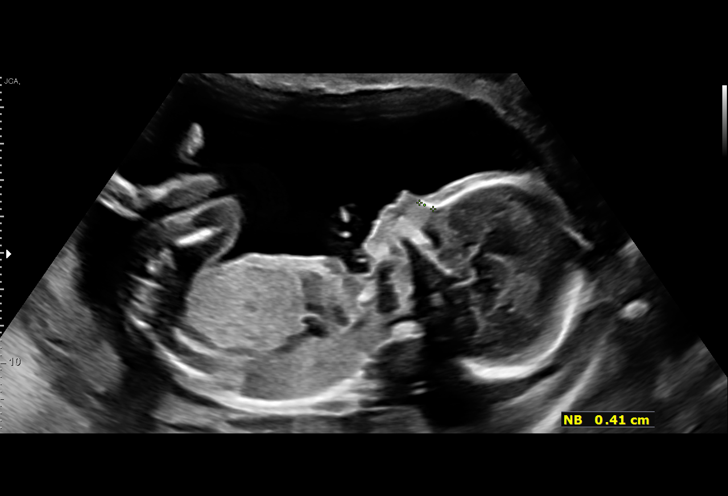
[im 60/65]
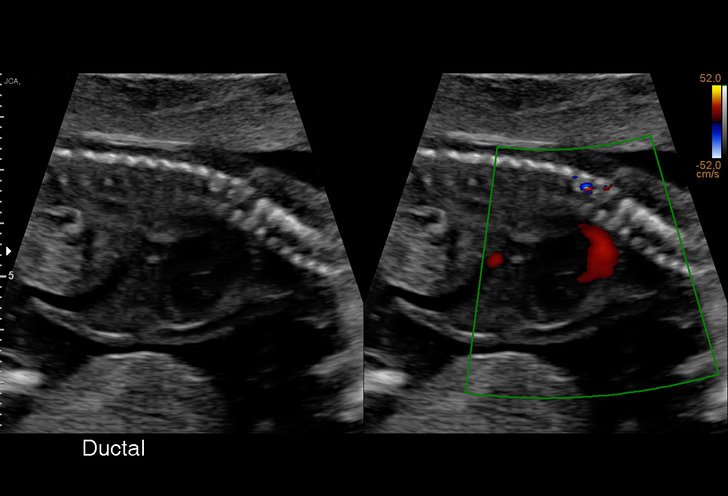
[im 65/65]
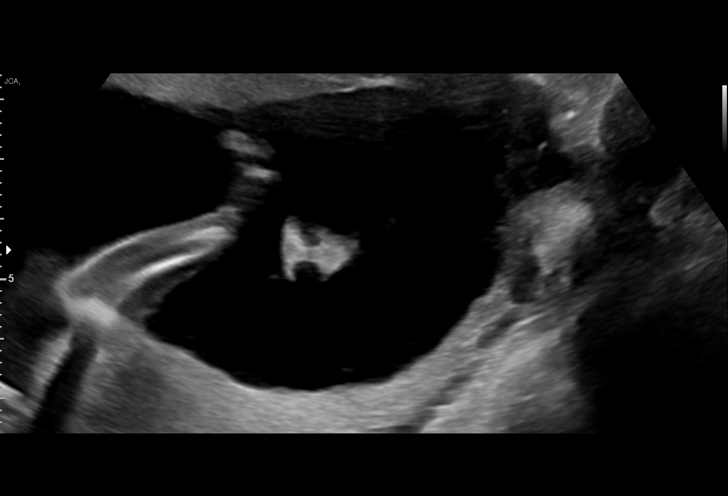

[14 of 28 positions shown; findings below may reference images not displayed]

1  US MFM OB COMP + 14 WK                76805.01    PLONG
                                                      PLONG

Indications

 19 weeks gestation of pregnancy
 Encounter for antenatal screening for          [WD]
 malformations
Fetal Evaluation

 Num Of Fetuses:         1
 Fetal Heart Rate(bpm):  148
 Cardiac Activity:       Observed
 Presentation:           Transverse, head to maternal left

 Amniotic Fluid
 AFI FV:      Within normal limits

                             Largest Pocket(cm)

Biometry

 BPD:        42  mm     G. Age:  18w 5d         38  %    CI:        69.29   %    70 - 86
                                                         FL/HC:      17.1   %    16.1 -
 HC:      161.1  mm     G. Age:  18w 6d         38  %    HC/AC:      1.26        1.09 -
 AC:      128.2  mm     G. Age:  18w 3d         26  %    FL/BPD:     65.7   %
 FL:       27.6  mm     G. Age:  18w 3d         24  %    FL/AC:      21.5   %    20 - 24
 HUM:      26.9  mm     G. Age:  18w 4d         38  %
 CER:      19.2  mm     G. Age:  18w 5d         23  %
 NFT:       3.8  mm
 LV:        6.4  mm
 CM:          5  mm
 Est. FW:     241  gm      0 lb 9 oz     18  %
OB History

 Gravidity:    5         Term:   4
 Living:       4
Gestational Age

 U/S Today:     18w 4d                                        EDD:   [DATE]
 Best:          19w 0d     Det. By:  Previous Ultrasound      EDD:   [DATE]
                                     ([DATE])
Anatomy

 Cranium:               Appears normal         LVOT:                   Not well visualized
 Cavum:                 Appears normal         Aortic Arch:            Appears normal
 Ventricles:            Appears normal         Ductal Arch:            Not well visualized
 Choroid Plexus:        Appears normal         Diaphragm:              Appears normal
 Cerebellum:            Appears normal         Stomach:                Appears normal, left
                                                                       sided
 Posterior Fossa:       Appears normal         Abdomen:                Appears normal
 Nuchal Fold:           Appears normal         Abdominal Wall:         Appears nml (cord
                                                                       insert, abd wall)
 Face:                  Appears normal         Cord Vessels:           Appears normal (3
                        (orbits and profile)                           vessel cord)
 Lips:                  Appears normal         Kidneys:                Appear normal
 Palate:                Appears normal         Bladder:                Appears normal
 Thoracic:              Appears normal         Spine:                  Appears normal
 Heart:                 Appears normal         Upper Extremities:      Appears normal
                        (4CH, axis, and
                        situs)
 RVOT:                  Not well visualized    Lower Extremities:      Not well visualized

 Other:  Fetus appears to be female. Nasal bone visualized. Technically
         difficult due to fetal position.
Cervix Uterus Adnexa

 Cervix
 Length:            3.8  cm.
 Normal appearance by transabdominal scan.

 Uterus
 No abnormality visualized.

 Right Ovary
 Not visualized.

 Left Ovary
 Not visualized.

 Cul De Sac
 No free fluid seen.

 Adnexa
 No abnormality visualized.
Impression

 Single intrauterine pregnancy here for a detailed anatomy
 Normal anatomy with measurements consistent with dates
 There is good fetal movement and amniotic fluid volume
 Suboptimal views of the fetal anatomy were obtained
 secondary to fetal position.

 Ms. PLONG, does not prefer male provders for ultrasound
 exams therefore the exam was not completed today.
Recommendations

 Follow up growth in 4-6 weeks.

## 2020-07-27 ENCOUNTER — Other Ambulatory Visit: Payer: Self-pay | Admitting: *Deleted

## 2020-07-27 DIAGNOSIS — Z362 Encounter for other antenatal screening follow-up: Secondary | ICD-10-CM

## 2020-08-03 ENCOUNTER — Other Ambulatory Visit: Payer: Self-pay

## 2020-08-03 ENCOUNTER — Ambulatory Visit (INDEPENDENT_AMBULATORY_CARE_PROVIDER_SITE_OTHER): Payer: Medicaid Other | Admitting: Family Medicine

## 2020-08-03 VITALS — BP 104/62 | HR 81 | Wt 134.4 lb

## 2020-08-03 DIAGNOSIS — O2612 Low weight gain in pregnancy, second trimester: Secondary | ICD-10-CM

## 2020-08-03 DIAGNOSIS — Z3492 Encounter for supervision of normal pregnancy, unspecified, second trimester: Secondary | ICD-10-CM

## 2020-08-03 DIAGNOSIS — O219 Vomiting of pregnancy, unspecified: Secondary | ICD-10-CM

## 2020-08-03 MED ORDER — ONDANSETRON 4 MG PO TBDP: 4.0000 mg | ORAL_TABLET | Freq: Three times a day (TID) | ORAL | 0 refills | Status: DC | PRN

## 2020-08-03 NOTE — Patient Instructions (Addendum)
It was wonderful to see you today.  Please bring ALL of your medications with you to every visit.   Today we talked about:  - F/u appt sched in 2 weeks to monitor weight gain   Thank you for choosing [facility].   Please call [phone] with any questions about today's appointment.  Please be sure to schedule follow up at the front  desk before you leave today.   Burley Saver, MD  Family Medicine

## 2020-08-04 LAB — RUBELLA SCREEN: Rubella Antibodies, IGG: 6.48 index (ref >0.99)

## 2020-08-04 LAB — ABO AND RH: Rh Factor: POSITIVE

## 2020-08-04 NOTE — Progress Notes (Signed)
  Patient Name: Nicole Morse Date of Birth: 01/01/1996 [facility] Prenatal Visit  Nicole Morse is a 25 y.o. [id number] at [redacted]w[redacted]d here for routine follow up. She is dated by 12 week ultrasound.  She reports significantly decreased appetite, but denies overt nausea or vomiting. States she has difficulty with pills and would prefer liquid prenatal vitamin/medications whenever possible. She endorses fetal movement. Denies vaginal bleeding, LOF, or contractions.  See flow sheet for details.  Vitals:   08/03/20 1149  BP: 104/62  Pulse: 81     A/P: Pregnancy at [redacted]w[redacted]d.  Doing well.    1. Routine Prenatal Care:  Marland Kitchen Dating reviewed, dating tab is correct . Fetal heart tones Appropriate . Influenza vaccine not administered as not influenza season.   Marland Kitchen COVID vaccination was discussed. She previously received 1 dose of Janssen. Declines booster today, but will consider it.  . The patient has the following indication for screening preexisting diabetes: Reviewed indications for early 1 hour glucose testing, not indicated . Marland Kitchen Anatomy ultrasound wnl but suboptimal views due to fetal position. Follow-up scan scheduled for 09/06/2020. Marland Kitchen Patient is not interested in genetic screening.  . Pregnancy education including expected weight gain in pregnancy, OTC medication use, continued use of prenatal vitamin, smoking cessation if applicable, and nutrition in pregnancy.   . Bleeding and pain precautions reviewed.  2. Pregnancy issues include the following and were addressed as appropriate today:  . Poor weight gain. Patient has lost 4lbs this pregnancy. She endorses decreased appetite and was unable to tolerate Diclegis. Discussed strategies such as frequent, small meals and encouraged Ensure shakes. Will give Rx for 10 tabs of Zofran ODT. Discussed appropriate use of Zofran and patient verbalized understanding. . Hep B non-immune. Plan for postpartum vaccination. . Refugee, language barrier.  Duration of today's encounter conducted with in-person interpreter.  . Problem list  and pregnancy box updated: Yes.   Follow up 4 weeks.

## 2020-08-05 ENCOUNTER — Encounter: Payer: Self-pay | Admitting: Family Medicine

## 2020-08-05 DIAGNOSIS — O2612 Low weight gain in pregnancy, second trimester: Secondary | ICD-10-CM | POA: Insufficient documentation

## 2020-08-05 HISTORY — DX: Low weight gain in pregnancy, second trimester: O26.12

## 2020-08-08 LAB — ANTIBODY SCREEN: Antibody Screen: NEGATIVE

## 2020-08-08 LAB — SPECIMEN STATUS REPORT

## 2020-08-15 ENCOUNTER — Encounter: Payer: Medicaid Other | Admitting: Family Medicine

## 2020-08-16 NOTE — Progress Notes (Signed)
     SUBJECTIVE:   CHIEF COMPLAINT / HPI:   [name] presents today accompanied by her husband for a follow up to check weight. IPAD Pashto interpreter used.   At last visit on 5/5 patient had lost 4 lbs during pregnancy due to fasting during Ramadan as well as nausea and decreased appetite. States she tried Diclegis but it made her sleepy.She was prescribed Zofran without much relief.    She endorses feeling light headed when standing feels like she is going to fall down when doing chores in the house feeling numb and tingling. States she has not had an appetite since being pregnant and is unable to drink water. She is requesting a medication to stimulate appetite.  She endorses still not taking a prenatal vitamin due to not being able to tolerate it. She also states it makes her blood pressure low. Discussed the importance of taking it and she asked if it was for baby or for her and I emphasized it Is very important to take to prevent neural tube defects in baby. She expressed understanding. .   Reassuringly, she has gained 4 lbs since her last visit 2 weeks ago.   OBJECTIVE:   BP 110/75   Pulse 87   Ht 5\' 6"  (1.676 m)   Wt 138 lb 3.2 oz (62.7 kg)   SpO2 98%   BMI 22.31 kg/m    General: alert, well appearing, NAD CV: RRR no murmurs Resp: CTAB normal WOB    ASSESSMENT/PLAN:   No problem-specific Assessment & Plan notes found for this encounter.   Nausea, lightheadedness  Encouraged patient to try to eat frequent small meals with protein shake supplements if able to tolerate which will help with nausea. Also highly encouraged increasing water intake which will help with her lightheadedness. Advised to add flavor or whatever is needed to increase water intake. Recommended trying a different pre natal vitamin and emphasized the importance of taking this. Reassured patient that BP is normal and that she has gained weight these past 2 weeks.  Poor weight gain in pregnancy,  improved  Last visit on 08/04/31 patient had 4 lb weight loss. Today she presents for a weight check and has gained 4 lbs.  - encouraged small frequent meals with protein shake supplements as discussed above   Return in 2 weeks for OB follow up appointment   10/04/31, DO [facility]

## 2020-08-17 ENCOUNTER — Other Ambulatory Visit: Payer: Self-pay

## 2020-08-17 ENCOUNTER — Ambulatory Visit (INDEPENDENT_AMBULATORY_CARE_PROVIDER_SITE_OTHER): Payer: Medicaid Other | Admitting: Family Medicine

## 2020-08-17 VITALS — BP 110/75 | HR 87 | Ht 66.0 in | Wt 138.2 lb

## 2020-08-17 DIAGNOSIS — R42 Dizziness and giddiness: Secondary | ICD-10-CM | POA: Diagnosis not present

## 2020-08-17 NOTE — Patient Instructions (Addendum)
It was great seeing you today!  Today you came in for concerns of not having an appetite and feeling light headed.  Unfortunately appetite stimulants aren't proven to be safe in pregnancy, so I recommend eating small meals frequently and supplementing with protein shakes to help get in more calories.  It is important to stay hydrated and I know you said you do not like water, but perhaps you can try getting flavored water or trying packets like crystal lite to add to your water.   I recommend you try a different prenatal vitamin, like a gummy vitamin mentioned at last visit with folic acid for baby's development.   It is reassuring that you have gained weight (4 pounds) since your last visit.   Please check-out at the front desk before leaving the clinic. Please schedule to see your PCP in 2 weeks for your next follow up, but if you need to be seen earlier than that for any new issues we're happy to fit you in, just give [place] a call!   Feel free to call with any questions or concerns at any time, at [phone].   Take care,  Dr. Cora Collum [facility]

## 2020-08-19 DIAGNOSIS — R42 Dizziness and giddiness: Secondary | ICD-10-CM | POA: Insufficient documentation

## 2020-08-31 ENCOUNTER — Encounter: Payer: Medicaid Other | Admitting: Family Medicine

## 2020-09-01 ENCOUNTER — Other Ambulatory Visit: Payer: Self-pay | Admitting: Family Medicine

## 2020-09-06 ENCOUNTER — Ambulatory Visit: Payer: Medicaid Other

## 2020-09-07 ENCOUNTER — Ambulatory Visit: Payer: Medicaid Other

## 2020-09-07 ENCOUNTER — Ambulatory Visit: Payer: Medicaid Other | Attending: Maternal & Fetal Medicine

## 2020-09-07 ENCOUNTER — Other Ambulatory Visit: Payer: Self-pay

## 2020-09-08 ENCOUNTER — Encounter: Payer: Medicaid Other | Admitting: Family Medicine

## 2020-09-13 NOTE — Progress Notes (Signed)
  [facility] Prenatal Visit  Nicole Morse is a 24 y.o. [id number] at [redacted]w[redacted]d here for routine follow up. She is dated by early ultrasound.  She reports she is feeling better today. Does report that most of the time she doesn't feel like eating. She reports fetal movement. Denies vaginal bleeding, loss of fluid, or contractions.  See flow sheet for details.  A/P: Pregnancy at [redacted]w[redacted]d.  Doing well.   Dating reviewed, dating tab is correct Fetal heart tones Appropriate Fundal height within expected range.  Influenza vaccine not administered as not influenza season.   COVID vaccination was discussed and patient has previously received one dose of Janssen vaccine. COVID booster offered today and patient amenable.  Screening for gestational diabetes completed today. Value >135, scheduled for 3 hour test.  Pregnancy education completed including: fetal growth, breastfeeding, contraception, and expected weight gain in pregnancy.   The patient does not have a history of Cesarean delivery and no referral to [facility] is indicated Will need to be scheduled for Faculty OB clinic in third trimester. Preterm labor, bleeding, and pain precautions given.    2. Pregnancy issues include the following and were addressed as appropriate today:   Hepatitis B non-immune. Will need vaccination post-partum       Refugee. Language-barrier: Pashto interpreter [phone] used for entirety of visit Reports decreased appetite. Reassuringly though, has gained 2 lbs since last visit.  COVID booster today. Elevated 1-hr GTT; Scheduled for 3-hour GTT on 6/21 at 9AM. Has not yet had anatomy U/S given scheduling conflicts with husband. Patient was scheduled again for this today. Appointment is for 6/30 at 7:30AM. Stressed importance of this.   Problem list and pregnancy box updated: Yes.   Follow up 4 weeks.

## 2020-09-14 ENCOUNTER — Ambulatory Visit: Payer: Medicaid Other | Admitting: Family Medicine

## 2020-09-14 ENCOUNTER — Ambulatory Visit (INDEPENDENT_AMBULATORY_CARE_PROVIDER_SITE_OTHER): Payer: Medicaid Other

## 2020-09-14 ENCOUNTER — Other Ambulatory Visit: Payer: Self-pay

## 2020-09-14 VITALS — BP 102/60 | HR 81 | Wt 140.4 lb

## 2020-09-14 DIAGNOSIS — R7309 Other abnormal glucose: Secondary | ICD-10-CM

## 2020-09-14 DIAGNOSIS — Z3492 Encounter for supervision of normal pregnancy, unspecified, second trimester: Secondary | ICD-10-CM | POA: Diagnosis not present

## 2020-09-14 DIAGNOSIS — Z23 Encounter for immunization: Secondary | ICD-10-CM | POA: Diagnosis not present

## 2020-09-14 HISTORY — DX: Other abnormal glucose: R73.09

## 2020-09-14 LAB — POCT 1 HR PRENATAL GLUCOSE: Glucose 1 Hr Prenatal, POC: 159 mg/dL

## 2020-09-14 NOTE — Patient Instructions (Addendum)
It was wonderful to see you today.  Please bring ALL of your medications with you to every visit.   We are doing a 1-hour Glucose test to test for diabetes today. We will let you know the results.   If you experience any vaginal bleeding, leakage of fluids, don't feel your baby moving as much, or start to have contractions less than 5 minutes apart please go directly to the Maternal Assessment Unit at [facility] for evaluation.  Maternity and women's care services located on the [place] side of [facility] [place]. [facility] (Entrance C off [address]  [address] [place],  [place]  [address]  ?? ???? ??? ???? ??? ?? ??.  ??????? ???? ?? ????? ?? ??? ??? ???? ?? ??? ??? ?????.  ??? ?? ??? ? ??? ?????? ?????? ???? ????? ? 1 ???? ?????? ??????? ???. ??? ?? ???? ?? ????? ?????.  ?? ???? ? ???????? ??? ???? ???? ?????? ????? ???? ? ??????? ????? ????? ? ??? ????? ? ???? ????? ?? ???? ?? ? 5 ????? ??? ?? ?? ????? ?? ?????? ??? ???? ??????? ???? ?????? ? ?????? ????? ? ???? ??? ?????? ?? ? ????? ?????? ????? ?? ??? ??.  ? ????? ?? ?????? ??????? ??????? ? ???? ??? ??? ??????? ?????? ???? ??? ?? ?????? ??? (? ???? ??? ??? ??? ? ?????? ????). [address]? [place] [id number]     ? ????? ?????? ????? ???? ???? ???? ????? ????.  ??????? ???? ? ?? ???? ????? ?? ??? ? ??? ?????? ??? [phone] ?? ??? ????.  ??????? ???? ??? ?????? ??? ?? ?? ??? ?? ???? ???? ?? ????? ??? ?? ????? ??????? ????.  ???????? ?????????? DO PGY-1 ????? ????   Thank you for choosing [facility].   Please call [phone] with any questions about today's appointment.  Please be sure to schedule follow up at the front  desk before you leave today.   Sabino Dick, DO PGY-1 Family Medicine

## 2020-09-18 ENCOUNTER — Other Ambulatory Visit: Payer: Medicaid Other

## 2020-09-19 ENCOUNTER — Other Ambulatory Visit: Payer: Self-pay | Admitting: Family Medicine

## 2020-09-19 ENCOUNTER — Other Ambulatory Visit: Payer: Medicaid Other

## 2020-09-19 ENCOUNTER — Other Ambulatory Visit: Payer: Self-pay

## 2020-09-19 DIAGNOSIS — R7309 Other abnormal glucose: Secondary | ICD-10-CM

## 2020-09-20 LAB — GESTATIONAL GLUCOSE TOLERANCE
Glucose, Fasting: 77 mg/dL (ref 65–94)
Glucose, GTT - 1 Hour: 139 mg/dL (ref 65–179)
Glucose, GTT - 2 Hour: 112 mg/dL (ref 65–154)
Glucose, GTT - 3 Hour: 135 mg/dL (ref 65–139)

## 2020-09-28 ENCOUNTER — Ambulatory Visit: Payer: Medicaid Other | Attending: Maternal & Fetal Medicine

## 2020-09-28 ENCOUNTER — Other Ambulatory Visit: Payer: Self-pay

## 2020-09-28 DIAGNOSIS — Z362 Encounter for other antenatal screening follow-up: Secondary | ICD-10-CM | POA: Diagnosis not present

## 2020-10-16 ENCOUNTER — Encounter: Payer: Medicaid Other | Admitting: Family Medicine

## 2020-10-24 ENCOUNTER — Other Ambulatory Visit: Payer: Self-pay

## 2020-10-24 ENCOUNTER — Ambulatory Visit: Payer: Medicaid Other | Admitting: Family Medicine

## 2020-10-24 ENCOUNTER — Other Ambulatory Visit (HOSPITAL_COMMUNITY): Payer: Self-pay

## 2020-10-24 VITALS — BP 100/70 | HR 73 | Wt 142.0 lb

## 2020-10-24 DIAGNOSIS — Z3480 Encounter for supervision of other normal pregnancy, unspecified trimester: Secondary | ICD-10-CM

## 2020-10-24 DIAGNOSIS — Z3492 Encounter for supervision of normal pregnancy, unspecified, second trimester: Secondary | ICD-10-CM

## 2020-10-24 DIAGNOSIS — R300 Dysuria: Secondary | ICD-10-CM

## 2020-10-24 DIAGNOSIS — Z23 Encounter for immunization: Secondary | ICD-10-CM

## 2020-10-24 MED ORDER — COMPLETENATE 29-1 MG PO CHEW: 1.0000 | CHEWABLE_TABLET | Freq: Every day | ORAL | 11 refills | Status: DC

## 2020-10-24 NOTE — Progress Notes (Addendum)
   In-person Pashto interpreter used during entire encounter.   [facility] Prenatal Visit  Nicole Morse is a 25 y.o. [id number] at [redacted]w[redacted]d here for routine follow up. She is dated by early ultrasound.  She reports fatigue, no bleeding, no contractions, no leaking, and sharp pains when walking which feels better when she sits down; this occurred with all of her previous pregnancies. She also complains of burning sensation when she urinates. Would like her urine check but declines STD testing at this time .  She reports fetal movement. She denies vaginal bleeding, contractions, or loss of fluid.  See flow sheet for details.  Vitals:   10/24/20 0958  BP: 100/70  Pulse: 73    A/P: Pregnancy at [redacted]w[redacted]d.  Doing well.   Routine prenatal care:   Prenatal care in second trimester Having issues with taking PNV pills. Will prescribe chews.  - RPR - HIV antibody (with reflex) - prenatal vitamin w/FE, FA (NATACHEW) 29-1 MG CHEW chewable tablet; Chew 1 tablet by mouth daily at 12 noon.  Dispense: 30 tablet; Refill: 11 - CBC  Dating reviewed, dating tab is correct Fetal heart tones: Appropriate Fundal height: within expected range.  The patient does not have a history of HSV and valacyclovir is not indicated at this time.  The patient does not have a history of Cesarean delivery and no referral to [facility] for [facility] is indicated Infant feeding choice: Both  Contraception choice: IUD Infant circumcision desired not applicable Influenza vaccine not administered as not influenza season.   Tdap was given today. COVID vaccination complete, consider discussing booster.  Childbirth and education classes were not offered during this visit. Pregnancy education regarding benefits of contraception were discussed.  Preterm labor and fetal movement precautions reviewed.   2. Pregnancy issues include the following and were addressed as appropriate today:  Dysuria Will obtain below  labs and treat appropriately.  - Culture, OB Urine - Urinalysis, dipstick only  Need for postpartum contraception Extensive talk about different options. Considering PPIUD. Continue having conversations with subsequent visits.    Problem list and pregnancy box updated: Yes.   Needs to be scheduled for Ob Faculty clinic in third trimester.   Follow up 2 weeks.

## 2020-10-24 NOTE — Patient Instructions (Signed)
[email or web address]  ????? ?????? ?? ????? Third Trimester of Pregnancy  ???? ????? ?????? ?? ????? ?? ????? ?? ??????? 28 ??? ??????? 40. ????? ??? ?? ????? ?????? ??? ????? ??????. ?????? ?????? ?? ????? ??? ???? ??? ?????? ???? ?? ???? ??? (??????) ????? ?????. ??? ????? ????? ??????? ???? ??? ?????? 20 ???? ??????? ????6-10 ????? ???????. ??????? ????? ?? ????? ????? ?????? ?? ????? ???? ????? ?????? ?? ?????? ????? ????? ?????? ?? ?????????. ???? ?????????????? ???? ???? ???? ???? ??? ?????? ??? ????? ?????. ?????? ????? ?????? ????? ?? ???????. ????? ?? ???? ???? ?????? 25-35 ???? (11-16 ???) ?????? ?????? ??? ??? ????? ??????? ??? ????? ?????. ???? ???? ?????? ?? ??? ????? ??????? ???? ????? ???? ?????? ?????? ??? 28 ?40 ????? (?? ??? 13 ?18 ??? ???????). ??? ??? ???? ?????? ?? ????? ?????? ?????? ???? ????? ???? ?????? ?????? ??? 15 ?25 ????? (?? ??? 7 ?11 ??? ???????). ??? ???? ???? ?????? ?????? ??? ??????? ?????? ????????. ?????? ??? ?????? ??? ?????? ???? ?????. ???? ?? ????? ???? ???? ?? ????? ???? (?????). ????? ??? ?????? ????? ???? ?????? ??????. ?? ???? ???????? ?? ?????. ????? ?? ???? ??? ?????????? ????? ???? ????? ??????? ??????????? ?? ?????. ?? ????? ??? ?????? ????? ?? ????? ?????? ?? ????? ????? ?? ????? ?? ???? ???? ????? ????? ?? ??????. ??? ???? ???? ??????. ?????? ?????? ???? ???? ?? ?????? ?? ????? ?? ????????. ?????? ???? ?? ?????? ?? ???? ?? ?? ??????. ?? ?????? ????????. ?? ?????? ?????????. ?? ???? ??????? ?? ?????? ?????? (????? ??????) ?? ?????. ?? ???? ???? ????? ?? ???? ????? ?? ????? ?? ????? ?? ???????. ???? ???? ??? ????? ????? ?????? ????????? ???? ???? ??? ??????? ???????. ?? ?????? ?????? ?? ????? ?? ??????? ?? ?????? ????????? ????????. ???? ?? ?????? ???? ?? ??? ????. ?? ?????? ???? ?? ?????? ???? ???? ?????. ??????? ???? ?? ??????? ?????? ????? ??? ?????? ????? ???? ????? ????? ???  ???????. ?? ???? ???? ?????? ?? ??????? ?? ?????. ??? ???? ??? ??? ??? ????? ?????? ??? ???? ??????? ?????? ??????? ??????? ?? ?????. ?? ??????? ??? ????? "????" ?? ????? ??? ???? ????? (?????). ?? ????? ????? ?? ????????? ????????. ?? ??????? ????? ??? ???? ????? ?? ???? ?????. ????? ????????? ??????? ?? ???????: ??????? ????? ??????? ????? ??????? ?????? ????? ????? ???????. ??? ????? ?? ???? ???? ?? ?? ??? ????? ??????? ?? ????? ?????. ??? ?? ??????? ????? ????? ??? ??? ?????? ???? ??????? ??????. ?????? ????????? ?? ??? ??????? ???? ????? ??? 600 ????????? (mcg) ?? ??? ???????. ????? ?????? ????? ?????? ??????? ????? ????? ??? ????? ????? ???????? ????? ????? ?????? ???? ???????? ??? ??????? ?????? ??????? ??????? ??????? ????? ?????. ????? ?????? ?????? ???????? ??? ???????? ?? ??????? ??????? ??????. ???? ????? ????? ??? ?????? ???? ?? ???? ???? ?????. ?????? 4 ?? 5 ????? ????? ?????? ????? ?? 3 ????? ?????. ????? ??????? ??? ????? ??? ????????? ??????? ?? ???????? ?? ????? ?? ???? ?????: ????? ????? ????? ?? ??????? ???? ??? ????? ???? ??????. ?????? ??????? ?????? ???????? ??? ????????? ??????? ??????? ???????? ?????????? ???????. ???? ????? ??????? ???? ????? ??? ??? ????? ?? ?????? ????????? ??????? ??? ??????? ??????? ?? ????????. ?????? ????? ??????? ??? ??????? ???? ??????? ??????. ???? ????? ?????? ??????? ????????? ?? ????? ???? ???????? ??????? ??? ?? ????? ?????. ????? ?????? ??????? 30 ????? ??? ????? ?????? ?? 5 ???? ?? ???????. ????? ?? ?????? ???????? ??? ???? ?????? ????????? ?? ?????. ????? ?? ?????? ???????? ??? ???? ???? ?? ?????? ?? ????? ????? ?? ???? ?????. ????? ??? ??????? ???????. ?? ?????? ???????? ???????? ??? ??? ???? ????? ???? ?? ????? ???? ?? ??? ???? ?? ???? ????? ?? ??? ?????. ??? ?????? ?????? ????????? ?? ?????? ??????? ???????? ?? ?? ???? ???? ???? ??????? ?????? ??????? ??? ?????? ????. ????? ?????? ?????? ???? ???????? ??????? ??? ????? ??????? ??  ??? (?????) ?????? ??? ???? ?????? ?? ?????? ????? ?? ???? ?? ???? ?????. ??? ?????? ????? ????? ?????? ????? ?? ??????? ?????? ?? ????????. ??????? ???? ??????? ??? ???? ???? ??????? ?????? ??? ???. ????? ????? ??? ???? ??? ????? ????? ????? ?????? ???? ?? ?????. ??? ?????? ???????? ????????: ????? ??????? ??????? ??????? ??? ??????? ???? ??????? ??????. ????? ????? ????   15 ????? ?? ??? 3-4 ???? ??????. ???? ????? ?? ?????? ???????. ??????? ??????? ???? ??????? ?????? ??????? ?? ??? ????? ??????? ?????. ?? ??????? ????? ?????? ??????? ?? ??? ?????? ?? ???????. ????? ???? ?????? ???? ??? ??????? ?????? ?? ????? ????. ??????? ??????? ??????? ?????? ??????? ??? ??? ???? ??????? ??????? ????? ?? ?????? ????. ??????? ??????? ??????? ????? ????: ????? ???? ?? ??? ??????? ???? ?????? ????????? ?????? ??? ?????? ???? ??????? ??? ???. ????? ????? ???????? ???? ????? ?? ????? ???????. ????? ???? ????? ????? ????? ?? ?????? ?????? ?? ????? ????? ?????? ?? ??????. ???? ???? ????? ?? ???? ????. ????? ?? ????? ???? ??????? ?????????? ??????? ?? ???? ????? ???? ????????. ??????? ???? ????? ???????? ?? ?????? ????? ????? ??????? ????????? ?? ??? ?????. ??? ??? ??????? ???? ?????? ???? ?? ???? ???? ????? ??? ?????. ??? ??? ????? ???? ????? ?? ??? ?? ?? ???? ??? ????? ???????. ?? ??????? ???? ??????? ?? ???????? ??????? ????????. ?? ??????? ????? ?????? ???????. ?? ??????? ?? ?????? ?????? ????? ??? ????????? ?? ????? ??? ??????? ???????? ??????????? ???? ?????. ???????? ???? ??????? ?????? ??? ????? ??? ???????? ??????? ?? ???????. ?? ??????? ?????? ????? ?? ?????? ??? ??????? ?? ????? ?? ???? ???. ?????? ?? ?????????? ???????? ?? ??? ???????? ?? ??? ?????. ?? ??????? ????????? ???????? ???????. ????? ???? ???? ?? ??? ??????? ???? ????? ?????? ?? ?????. ???? ???????? ??????? ??????? ??????? (?????? ?????? ?????)? ????? ???? ??????? ?????? ??? ????? ?????? ???? ?????? ??????? ?????? ???? ???? ??????. ??????  ????? ?????? ????????. ???? ??? ???. ????? ?????? ??? ?????? ?? ?????????: ??????? ????????? ????? (American Pregnancy Association):? [email or web address] ?????? ????????? ??? ?????? ???????? [employer]nec[employer]):?? [email or web address] ???? ??? ?????? (Office on Women's Health):? [email or web address] ????? ???????? ??????? ?????? ??? ????? ????? ??????? ???????: ??????? ???????. ?????? ??????? ????? ??????? ?? ??? ?? ?????? ?? ??? ???? ?? ????? ??????? ?? ???? ??????. ???? ?? ?????. ??????? ?????? ????? ???????? ?? ??? ???? ???????. ???? ??? ??? ??????. ???? ???? ?? ???? ??? ?? ????? ??????. ?????? ?????? ?? ?????? ??? ???????? ?? ???? ??? ?? ???? ?????. ????? ???????? ????? ?? ??????? ???????: ???? ?????. ???? ???????? ?????? ???? ????? ??? ?? 5 ?????. ???? ????? ?? ???? ?? ??????. ?????? ???? ??? ?? ?????. ??????? ?????? ?? ??????. ?????? ???? ?? ?????. ?????? ?????? ?????. ??? ?????? ????? ????? ???????? ??? ?????? ???? ????? ??? ???? ??????? ?????? ??????? ???. ?????? ???? ???? ?? ?????? ?? ???? ?? ?????? ?? ?????? ?? ?????. ???? ???? ????? ?????? ?? ????? ????? ?? ??????? 28 ??? ??????? 40 (?? ????? ?????? ??? ????? ??????). ?? ???? ???? ?????? ?? ??????? ?? ?????. ??? ???? ??? ??? ??? ????? ?????? ??? ???? ??????? ?????? ??????? ??????? ?? ?????. ????? ???? ???? ?? ??? ??????? ???? ????? ?????? ?? ?????. ?????? ????? ?????? ????????. ???? ??? ???. ??? ????? ?? ??? ????????? ?? ???? ?????? ????????? ???? ?????? ???? ?????????????. ???? ?? ?????? ??? ????? ???? ?? ???? ?? ???? ??????? ??????.? Document Revised: 09/20/2019 Document Reviewed: 09/20/2019 Elsevier Patient Education  2022 [employer].

## 2020-10-25 LAB — CBC
Hematocrit: 34.6 % (ref 34.0–46.6)
Hemoglobin: 11.9 g/dL (ref 11.1–15.9)
MCH: 30.6 pg (ref 26.6–33.0)
MCHC: 34.4 g/dL (ref 31.5–35.7)
MCV: 89 fL (ref 79–97)
Platelets: 191 10*3/uL (ref 150–450)
RBC: 3.89 x10E6/uL (ref 3.77–5.28)
RDW: 12.1 % (ref 11.7–15.4)
WBC: 9.7 10*3/uL (ref 3.4–10.8)

## 2020-10-25 LAB — HIV ANTIBODY (ROUTINE TESTING W REFLEX): HIV Screen 4th Generation wRfx: NONREACTIVE

## 2020-10-25 LAB — URINALYSIS, DIPSTICK ONLY
Bilirubin, UA: NEGATIVE
Glucose, UA: NEGATIVE
Ketones, UA: NEGATIVE
Nitrite, UA: NEGATIVE
RBC, UA: NEGATIVE
Specific Gravity, UA: >=1.03 — AB (ref 1.005–1.030)
Urobilinogen, Ur: 0.2 mg/dL (ref 0.2–1.0)
pH, UA: 5.5 (ref 5.0–7.5)

## 2020-10-25 LAB — RPR: RPR Ser Ql: NONREACTIVE

## 2020-10-27 LAB — CULTURE, OB URINE

## 2020-10-27 LAB — URINE CULTURE, OB REFLEX

## 2020-10-28 ENCOUNTER — Encounter: Payer: Self-pay | Admitting: Family Medicine

## 2020-11-13 NOTE — Progress Notes (Signed)
  [facility] Prenatal Visit  Nicole Morse is a 25 y.o. [id number] at [redacted]w[redacted]d here for routine follow up. She is dated by early ultrasound.  She reports backache and bilateral inguinal pain .  She reports fetal movement. She denies vaginal bleeding, contractions, or loss of fluid.  See flow sheet for details.  In person Pashto interpreter: Salvadore Oxford, [employer]  Back ache and bilateral inguinal pain - Feels like low belly pressure - Most pain when walking a lot or standing for a long time - Has happened with her previous 4 pregnancies, just a little worse with this one - No contractions - No LOF or vaginal bleeding - Good fetal movement  Vitals:   11/14/20 1343  BP: 117/71  Pulse: 72    A/P: Pregnancy at [redacted]w[redacted]d.  Doing well.   Routine prenatal care:  Dating reviewed, dating tab is correct Fetal heart tones: Appropriate - 155 bpm Fundal height: within expected range.  (33 cm) The patient does not have a history of HSV and valacyclovir is not indicated at this time.  The patient does not have a history of Cesarean delivery and no referral to [facility] is indicated Infant feeding choice: Both  Contraception choice: IUD Infant circumcision desired not applicable - female sex Influenza vaccine not administered as not influenza season.   Tdap was not given today. (Given 10/24/2020) COVID vaccination administered 09/14/2020. Childbirth and education classes were offered, limited by language and literacy barriers.  Pregnancy education regarding benefits of breastfeeding, contraception, fetal growth, expected weight gain, and safe infant sleep were discussed.  Preterm labor and fetal movement precautions reviewed.   2. Pregnancy issues include the following and were addressed as appropriate today:  She will be due for GBS swab and repeat Gc/Ch at her next appointment in two weeks.        At her next follow up appointment, she  will need to be scheduled with [facility] faculty clinic for her 3rd trim appt, see if there are any earlier openings due to cancellations. There were limited availabilities for scheduling, next is 9/22. Made her an appointment for 9/22 at 10am as a placeholder (first available), but at that time she will be 40.[redacted] weeks gestation.        Round ligament pain: Recommended Tylenol as needed and pregnancy support belt.  Demonstrated how to make one at home with a scarf.  Problem list and pregnancy box updated: Yes.   Scheduled for Ob Faculty clinic in third trimester on 9/22 at 10am.   Follow up 2 weeks.   Fayette Pho, MD

## 2020-11-13 NOTE — Patient Instructions (Addendum)
I have translated the following text using Google translate.  As such, there are many errors.  I apologize for the poor written translation; however, we do not have written  translation services yet.  ?? ????? ??? ? ???? ????? ?? ?????? ??? ?????? ??. ?? ?? ????? ???? ???? ???? ???. ?? ? ???? ???? ??? ????? ????? ????? ?????? ?? ??????? ??? ?? ???? ? ????? ????? ??????? ?? ???.  It was wonderful to see you today. Thank you for allowing me to be a part of your care. Below is a short summary of what we discussed at your visit today: ?? ?? ??? ???? ?? ?? ?? ??? ???? ?????. ???? ?? ?? ?? ????? ????? ?? ????? ? ??????? ???? ??. ????? ? ??? ?? ????? ?? ?? ??? ?? ????? ?? ????? ?? ???  ???:  Belly Pain - you may take tylenol (acetaminophen), which can be found over the counter - you may also consider a pregnancy support belt, which would take weight off your pelvis - show this picture to your sponsor for help obtaining one ? ???? ??? - ???? ???? ?? ??????? (?????????) ?????? ??? ?? ?? ?????? ?? ????? ???? ?? - ???? ???? ?? ? ????????? ? ????? ?????? ?? ?? ??? ?? ?????? ??? ?? ????? ? ????? ??? ???? - ?? ??? ??? ?????? ?? ??????? ???? ? ?????? ???? ????? ????? ????  Prenatal visits At this point in pregnancy, we have women come back every 2 weeks for a quick check in with the doctor.  Details about your next appointment may be found further down in this packet under "upcoming visits". ? ????? ???? ????? ? ????????? ?? ?? ????? ??? ??? ?????? ?? ??? 2 ????? ?? ? ????? ??? ? ??? ?????? ????? ????? ????. ????? ? ???????? ????? ?? ??? ??????? ???? ?? ?? ????? ?? ? "???????? ?????" ????? ?????? ??.  Emergency Planning If you experience any vaginal bleeding, leakage of fluids, don't feel your baby moving as much, or start to have contractions less than 5 minutes apart please go directly to the Maternal Assessment Unit at [facility] for evaluation. ? ????? ???? ?????? ?? ???? ? ????? ?? ???? ??????  ????? ???? ? ??????? ????? ????? ? ??? ????? ? ???? ????? ?? ???? ?? ? 5 ????? ??? ?? ???? ?? ?????? ??? ???? ??????? ???? ?????? ? ?????? ????? ? ???? ??? ?????? ?? ? ????? ?????? ????? ?? ??? ??.  Maternity and women's care services located on the [place] side of [facility] (Entrance C off [address]).  [address] [place],  [place]  [address] ? ????? ?? ?????? ??????? ??????? ? ???? ??? ??? ??????? ?????? ?? ???? ??? ?? ?????? ??? (? ???? ??? ??? ??? ? ?????? ????). ? [address] ????? ????? ??? ?? ????? C ?????????? [place] [id number]    [employer] Today you signed a release form so we may use your information to get you set up with the [facility] transportation service. Call [phone] to set up complementary rides to and from your health appointments within the [facility] system.     If you have any questions or concerns, please do not hesitate to contact us via phone or MyChart message.  ?? ???? ???? ?????? ?? ??????? ???? ??????? ???? ? ?????? ?? MyChart ????? ?? ???? ??? ??? ????? ?????.    Fayette Pho, MD

## 2020-11-14 ENCOUNTER — Ambulatory Visit (INDEPENDENT_AMBULATORY_CARE_PROVIDER_SITE_OTHER): Payer: Medicaid Other | Admitting: Family Medicine

## 2020-11-14 ENCOUNTER — Other Ambulatory Visit: Payer: Self-pay

## 2020-11-14 DIAGNOSIS — Z3480 Encounter for supervision of other normal pregnancy, unspecified trimester: Secondary | ICD-10-CM

## 2020-11-15 ENCOUNTER — Telehealth: Payer: Self-pay | Admitting: Family Medicine

## 2020-11-15 NOTE — Telephone Encounter (Signed)
Called patient with Pashto phone interpreter, name Nicole Morse ID# [id number] regarding future scheduled appointments.   After our OB follow-up yesterday, this patient use the interpreter to help schedule next OB follow up visit 9/9, at 38.[redacted] weeks gestation.   Called her home number and spoke to her husband.  Requested that she come in sooner so that he may do the GBS swab with a female provider.  Worked with husband to find an appointment he could also attend with his work schedule.  Scheduled 8/30 at 10:30 AM with Dr. Lum Babe for GBS swab and GC/CH collection at 36.[redacted] weeks gestation.   Fayette Pho, MD

## 2020-11-28 ENCOUNTER — Ambulatory Visit: Payer: Medicaid Other | Admitting: Family Medicine

## 2020-11-29 ENCOUNTER — Other Ambulatory Visit (HOSPITAL_COMMUNITY)
Admission: RE | Admit: 2020-11-29 | Discharge: 2020-11-29 | Disposition: A | Discharge status: Home / Self Care | Payer: Medicaid Other | Source: Ambulatory Visit | Attending: Family Medicine | Admitting: Family Medicine

## 2020-11-29 ENCOUNTER — Other Ambulatory Visit: Payer: Self-pay

## 2020-11-29 ENCOUNTER — Ambulatory Visit (INDEPENDENT_AMBULATORY_CARE_PROVIDER_SITE_OTHER): Payer: Medicaid Other | Admitting: Family Medicine

## 2020-11-29 VITALS — BP 101/63 | HR 86 | Wt 151.6 lb

## 2020-11-29 DIAGNOSIS — Z3480 Encounter for supervision of other normal pregnancy, unspecified trimester: Secondary | ICD-10-CM

## 2020-11-29 NOTE — Progress Notes (Signed)
  [facility] Prenatal Visit  Nicole Morse is a 25 y.o. [id number] at [redacted]w[redacted]d here for routine follow up. She is dated by early ultrasound. She reports fetal movement. She denies vaginal bleeding, contractions, or loss of fluid. See flow sheet for details.  She reports continued round ligament pain only at night; attempted to make a belly band with a scarf but it just puts too much pressure on her abdomen. She has not tried tylenol, does not want to take it because she is afraid it will affect the baby.   Vitals:   11/29/20 0842  BP: 101/63  Pulse: 86    A/P: Pregnancy at [redacted]w[redacted]d.  Doing well.   Routine prenatal care  Dating reviewed, dating tab is correct Fetal heart tones Appropriate 140 Fundal height within expected range.  36 cm Fetal position confirmed Vertex using Ultrasound .  GBS collected today. .  Repeat GC/CT collected today.  The patient does not have a history of HSV and valacyclovir is not indicated at this time.  Infant feeding choice: Both  Contraception choice: IUD Infant circumcision desired not applicable Influenza vaccine not administered as not influenza season.   Tdap previously administered between 27-36 weeks  Pregnancy education regarding preterm labor, fetal movement,  contraception, and epidural were discussed. She desires epidural, she did not have an epidural with her prior pregnancies.    2. Pregnancy issues include the following and were addressed as appropriate today:   Round ligament pain- discussed using pillows to support her abdomen and lower back. If she does not want to utilize tylenol and she finds the pain tolerable this is ok.    Problem list and pregnancy box updated: No, reviewed by this provider.   Follow up 1 week. Appt w/ PCP is already establish. Difficult to get her into Faculty clinic prior to known due date. She is currently scheduled for 9/22. Please consider rescheduling for earlier if appt time available at follow up  visit.

## 2020-11-29 NOTE — Patient Instructions (Addendum)
Thank you for coming in today.  Today we discussed using pillows for side belly pain.  You will come back in 1 week.  In the meantime please continue to make sure that baby is moving.  Go to maternity assessment unit if you have any decrease of movement, contractions that continued, any vaginal bleeding or loss of fluid.  Dr. Salvadore Dom  ???? ?? ?? ????.  ?? ???? ? ??? ??? ????? ? ????? ?????? ?? ??? ??? ???.  ???? ?? ?? 1 ???? ?? ????? ????.  ?? ?? ??? ?? ??????? ???? ??? ?????? ??? ?? ?????  ???? ???.  ? ???????? ? ?????? ???? ?? ??? ?? ?? ???? ? ???? ??? ?????? ???? ??? ??????? ?? ???? ???? ? ???? ???? ?? ?? ? ??????? ?? ???? ???? ???.

## 2020-11-30 LAB — CERVICOVAGINAL ANCILLARY ONLY
Chlamydia: NEGATIVE
Comment: NEGATIVE
Comment: NORMAL
Neisseria Gonorrhea: NEGATIVE

## 2020-12-02 ENCOUNTER — Encounter: Payer: Self-pay | Admitting: Family Medicine

## 2020-12-03 LAB — CULTURE, BETA STREP (GROUP B ONLY): Strep Gp B Culture: NEGATIVE

## 2020-12-08 ENCOUNTER — Encounter: Payer: Self-pay | Admitting: Family Medicine

## 2020-12-08 ENCOUNTER — Ambulatory Visit (INDEPENDENT_AMBULATORY_CARE_PROVIDER_SITE_OTHER): Payer: Medicaid Other | Admitting: Family Medicine

## 2020-12-08 ENCOUNTER — Other Ambulatory Visit: Payer: Self-pay

## 2020-12-08 VITALS — BP 100/62 | HR 74 | Wt 152.0 lb

## 2020-12-08 DIAGNOSIS — Z3493 Encounter for supervision of normal pregnancy, unspecified, third trimester: Secondary | ICD-10-CM

## 2020-12-08 DIAGNOSIS — R42 Dizziness and giddiness: Secondary | ICD-10-CM

## 2020-12-08 NOTE — Progress Notes (Addendum)
  [facility] Prenatal Visit  Nicole Morse is a 25 y.o. [id number] at [redacted]w[redacted]d here for routine follow up. She is dated by early ultrasound.  She reports backache, fatigue, occasional contractions, and round ligament pain . She reports fetal movement. She denies vaginal bleeding, contractions, or loss of fluid. See flow sheet for details.  Vitals:   12/08/20 1016  BP: 100/62  Pulse: 74    A/P: Pregnancy at [redacted]w[redacted]d.  Doing well.   Routine prenatal care:  Dating reviewed, dating tab is correct Fetal heart tones Appropriate - 135 bpm Fundal height within expected range. 37 cm Fetal position confirmed Vertex using Ultrasound .  Infant feeding choice: Both  Contraception choice: IUD Infant circumcision desired not applicable Pain control in labor discussed and patient desires epidural.  Influenza vaccine not administered as not influenza season.   Tdap previously administered between 27-36 weeks  (10/24/20) GBS and gc/chlamydia testing results were reviewed today.   Pregnancy education regarding labor, fetal movement,  benefits of breastfeeding, contraception, and safe infant sleep were discussed.  Labor and fetal movement precautions reviewed. Induction of labor not discussed.   2. Pregnancy issues include the following and were addressed as appropriate today:   Discussed how to get to the hospital when she begins labor. Instructed on how to call ambulance (no car, cannot self-schedule [place] transportation due to language barrier). Patient and husband say neighbors or patient's brother can watch their children while they are in the hospital for delivery.   Problem list and pregnancy box updated: Yes.   Follow up 1 week.    Fayette Pho, MD

## 2020-12-08 NOTE — Patient Instructions (Signed)
I have translated the following text using Google translate.  As such, there are many errors.  I apologize for the poor written translation; however, we do not have written  translation services yet.  ?? ????? ??? ? ???? ????? ?? ?????? ??? ?????? ??. ?? ?? ????? ???? ???? ???? ???. ?? ? ???? ???? ??? ????? ????? ????? ?????? ?? ??????? ??? ?? ???? ? ????? ????? ??????? ?? ???.  It was wonderful to see you today. Thank you for allowing me to be a part of your care. Below is a short summary of what we discussed at your visit today: ?? ?? ??? ???? ?? ?? ?? ??? ???? ?????. ???? ?? ?? ?? ????? ????? ?? ????? ? ??????? ???? ??. ????? ? ??? ?? ????? ?? ?? ??? ?? ????? ?? ????? ?? ??? ???:  Emergency Planning If you experience any vaginal bleeding, leakage of fluids, don't feel your baby moving as much, or start to have contractions less than 5 minutes apart please go directly to the Maternal Assessment Unit at [facility] for evaluation. ? ????? ???? ?????? ?? ???? ? ????? ?? ???? ?????? ????? ???? ? ??????? ????? ????? ? ??? ????? ? ???? ????? ?? ???? ?? ? 5 ????? ??? ?? ???? ?? ?????? ??? ???? ??????? ???? ?????? ? ?????? ????? ? ???? ??? ?????? ?? ? ????? ?????? ????? ?? ??? ??.  Maternity and women's care services located on the [place] side of The [facility] (Entrance [address] off [address]).  [address] [place],  [place]  [address] ? ????? ?? ?????? ??????? ??????? ? ???? ??? ??? ??????? ?????? ?? ???? ??? ?? ?????? ??? (? ???? ??? ??? ??? ? ?????? ????). ? [address] ????? ????? ??? ?? ????? C ?????????? [place] [id number]       If you have any questions or concerns, please do not hesitate to contact us via phone or MyChart message.   Fayette Pho, MD

## 2020-12-15 ENCOUNTER — Ambulatory Visit: Payer: Medicaid Other | Admitting: Family Medicine

## 2020-12-15 ENCOUNTER — Other Ambulatory Visit: Payer: Self-pay

## 2020-12-15 VITALS — BP 97/56 | HR 80 | Ht 66.0 in | Wt 152.4 lb

## 2020-12-15 DIAGNOSIS — Z3493 Encounter for supervision of normal pregnancy, unspecified, third trimester: Secondary | ICD-10-CM | POA: Diagnosis not present

## 2020-12-15 DIAGNOSIS — Z3A39 39 weeks gestation of pregnancy: Secondary | ICD-10-CM

## 2020-12-15 NOTE — Progress Notes (Signed)
  [facility] Prenatal Visit  Nicole Morse is a 25 y.o. [id number] at [redacted]w[redacted]d here for routine follow up. She is dated by 12 week ultrasound.  She reports pelvic pain at night when she lays on one side. She reports good fetal movement. She denies vaginal bleeding, contractions, or loss of fluid. Flow sheet not updated (not scheduled as OB visit in Epic).  Vitals:   12/15/20 1016  BP: (!) 97/56  Pulse: 80  SpO2: 100%    A/P: Pregnancy at [redacted]w[redacted]d.  Doing well.   Routine prenatal care:  Dating reviewed, dating tab is correct Fetal heart tones Appropriate - 143bpm Fundal height within expected range.  Fetal position confirmed Vertex using Ultrasound .  Infant feeding choice: Both  Contraception choice: IUD Infant circumcision: not applicable Pain control in labor discussed and patient desires epidural.  Influenza vaccine not administered as patient declined, will continue to discuss.   Tdap previously administered between 27-36 weeks  GBS and gc/chlamydia testing results were reviewed today.   Pregnancy education regarding labor, fetal movement,  benefits of breastfeeding, contraception, and safe infant sleep were discussed.  Labor and fetal movement precautions reviewed. Induction of labor discussed. Patient declines. Will continue to discuss at her next visit if applicable. Patient also hesitant about BPP. This will be scheduled between 40-41 weeks, but after her next visit so additional counseling can be provided.  2. 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   Follow up 1 week.

## 2020-12-15 NOTE — Patient Instructions (Signed)
It was great to see you! Hopefully the next time you come to our office you will have delivered your baby!  Pregnancy Related Return Precautions The follow are signs/symptoms that are abnormal in pregnancy and may require further evaluation by a physician: Go to the MAU at [facility] at [facility] if: You have cramping/contractions that do not go away with drinking water, especially if they are lasting 30 seconds to 1.5 minutes, coming and going every 5-10 minutes for an hour or more, or are getting stronger and you cannot walk or talk while having a contraction/cramp. Your water breaks.  Sometimes it is a big gush of fluid, sometimes it is just a trickle that keeps getting your underwear wet or running down your legs You have vaginal bleeding.    You do not feel your baby moving like normal.  If you do not, get something to eat and drink (something cold or something with sugar like peanut butter or juice) and lay down and focus on feeling your baby move. If your baby is still not moving like normal, you should go to MAU. You should feel your baby move 6 times in one hour, or 10 times in two hours. You have a persistent headache that does not go away with 1 g of Tylenol, vision changes, chest pain, difficulty breathing, severe pain in your right upper abdomen, worsening leg swelling- these can all be signs of high blood pressure in pregnancy and need to be evaluated by a provider immediately  These are all concerning in pregnancy and if you have any of these I recommend you call your PCP and present to the Maternity Admissions Unit (map below) for further evaluation.  For any pregnancy-related emergencies, please go to the Maternity Admissions Unit in the [facility] at [facility]. You will use hospital Entrance C.

## 2020-12-22 ENCOUNTER — Telehealth: Payer: Self-pay | Admitting: *Deleted

## 2020-12-22 NOTE — Telephone Encounter (Signed)
[facility] to contact [facility] and they were not available, will call 12/25/2020 to try and schedule. Estha Few Zimmerman Rumple, CMA

## 2020-12-22 NOTE — Telephone Encounter (Signed)
-----   Message from Maury Dus, MD sent at 12/18/2020  1:50 PM EDT ----- Please schedule BPP for 9/22 or later. Thank you!

## 2020-12-24 NOTE — Telephone Encounter (Addendum)
Hi [facility] white hall team,  This patient no-showed to her OB clinic appointment on Thursday. I was hoping to call her afterward but did not have time while precepting that afternoon, and was out on Friday.   She needs her BPP scheduled ASAP as she is now [redacted]w[redacted]d - ideally needs this tomorrow 9/26 if at all possible. If BPP is too challenging to schedule, NST with AFI will suffice.  I scheduled her for induction of labor due to being post-dates. Her appointment is for 12/28/20 in the morning. L&D will contact her with specific information on when to arrive.   I am out of the office tomorrow, with no internet and limited phone service at home so I am not able to reliably coordinate this from home. White team, can you please inform patient of her induction appointment?  Copying both Dr. Anner Crete and PCP Dr. Larita Fife so all are in the loop.  Thanks, Latrelle Dodrill, MD

## 2020-12-25 ENCOUNTER — Telehealth: Payer: Self-pay | Admitting: Family Medicine

## 2020-12-25 NOTE — Telephone Encounter (Signed)
Just got a page from MFM clinic, Nicole Morse, regarding appointment tomorrow. Her supervisor said given that patient is post-dates that this must be a BPP. They have a 3:30 pm opening. I agreed.   **Patient's appointment is now tomorrow 9/26 at 3:30 pm for a BPP at the MFM clinic at [facility].**  Nicole Pho, MD

## 2020-12-25 NOTE — Telephone Encounter (Addendum)
Called [facility]. The earliest appointment for BPP or NST with AFI was this Friday, 9/30. Given that she is scheduled for induction 9/29, this will not work.   I was directed to call the Maternal Fetal Care Clinic at the [facility] at [phone] to see if they have any openings. I am currently on hold with the mat/fet care clinic (as of 4:02pm 12/25/20).  Fayette Pho, MD

## 2020-12-25 NOTE — Telephone Encounter (Signed)
I also contacted pt using Interpreter [phone] and pts husband said that he had just spoken to someone about this so he is aware of appointment and time and location. Gage Weant Zimmerman Rumple, CMA

## 2020-12-25 NOTE — Telephone Encounter (Signed)
Called [facility] at [phone] to set up transportation to and from her MFM appointment. [facility] will pick her up around 3:05 pm tomorrow.   I will try again later to get in contact with the patient with interpreter.   Fayette Pho, MD

## 2020-12-25 NOTE — Telephone Encounter (Signed)
Called to schedule BPP. Waited on hold for over 7 mins. Will try to call again. Sunday Spillers, CMA

## 2020-12-25 NOTE — Telephone Encounter (Signed)
Tried to call with Pashto interpreter to inform patient of appointment tomorrow with Maternal Fetal Care Clinic. Interpreter tried twice, no answer and no option to leave voicemail.   May have to have someone else try to call patient again later with interpreter.   Fayette Pho, MD

## 2020-12-25 NOTE — Telephone Encounter (Signed)
Okay, I was able to get her scheduled for NST with AFI tomorrow (9/27) at 2:15 pm, arrive time of 2 pm. This will be at the [facility] Fetal Care Clinic within [facility].   I will attempt to call patient with interpreter and relay the details.   Fayette Pho, MD

## 2020-12-25 NOTE — Telephone Encounter (Signed)
Called pt using pacific interpreters. (Nisar, [phone]). Spoke with husband. Informed him of the appt tomorrow at 3:30 to check on the baby. Informed husband that transportation will be there at 3:05 to pick them up. Husband understood and hopes transportation will arrive on time. Sunday Spillers, CMA

## 2020-12-25 NOTE — Telephone Encounter (Signed)
I called to schedule BPP. I received answering service. I will call back after lunch. Sunday Spillers, CMA

## 2020-12-26 ENCOUNTER — Other Ambulatory Visit: Payer: Self-pay | Admitting: Family Medicine

## 2020-12-26 ENCOUNTER — Other Ambulatory Visit: Payer: Self-pay

## 2020-12-26 ENCOUNTER — Encounter: Payer: Self-pay | Admitting: *Deleted

## 2020-12-26 ENCOUNTER — Ambulatory Visit: Payer: Medicaid Other

## 2020-12-26 ENCOUNTER — Telehealth (HOSPITAL_COMMUNITY): Payer: Self-pay | Admitting: *Deleted

## 2020-12-26 ENCOUNTER — Ambulatory Visit: Payer: Medicaid Other | Admitting: *Deleted

## 2020-12-26 ENCOUNTER — Ambulatory Visit: Payer: Medicaid Other | Attending: Family Medicine

## 2020-12-26 VITALS — BP 100/72 | HR 85

## 2020-12-26 DIAGNOSIS — Z363 Encounter for antenatal screening for malformations: Secondary | ICD-10-CM | POA: Insufficient documentation

## 2020-12-26 DIAGNOSIS — R42 Dizziness and giddiness: Secondary | ICD-10-CM | POA: Insufficient documentation

## 2020-12-26 DIAGNOSIS — O480 Post-term pregnancy: Secondary | ICD-10-CM | POA: Diagnosis not present

## 2020-12-26 DIAGNOSIS — Z3A40 40 weeks gestation of pregnancy: Secondary | ICD-10-CM | POA: Diagnosis not present

## 2020-12-26 NOTE — Telephone Encounter (Signed)
Preadmission screen Interpreter number [phone] Message left with her husband.

## 2020-12-27 ENCOUNTER — Other Ambulatory Visit: Payer: Self-pay | Admitting: Advanced Practice Midwife

## 2020-12-28 ENCOUNTER — Inpatient Hospital Stay (HOSPITAL_COMMUNITY): Payer: Medicaid Other

## 2020-12-28 NOTE — Telephone Encounter (Signed)
Error. Duplicate entry.  Haniel Fix, MD  

## 2020-12-29 ENCOUNTER — Encounter (HOSPITAL_COMMUNITY): Payer: Self-pay | Admitting: Obstetrics & Gynecology

## 2020-12-29 ENCOUNTER — Inpatient Hospital Stay (HOSPITAL_COMMUNITY)
Admission: AD | Admit: 2020-12-29 | Discharge: 2020-12-30 | DRG: 806 | Disposition: A | Discharge status: Home / Self Care | Payer: Medicaid Other | Attending: Obstetrics and Gynecology | Admitting: Obstetrics and Gynecology

## 2020-12-29 ENCOUNTER — Inpatient Hospital Stay (HOSPITAL_COMMUNITY): Payer: Medicaid Other | Admitting: Anesthesiology

## 2020-12-29 ENCOUNTER — Other Ambulatory Visit: Payer: Self-pay

## 2020-12-29 DIAGNOSIS — B1910 Unspecified viral hepatitis B without hepatic coma: Secondary | ICD-10-CM | POA: Diagnosis present

## 2020-12-29 DIAGNOSIS — O9842 Viral hepatitis complicating childbirth: Secondary | ICD-10-CM | POA: Diagnosis present

## 2020-12-29 DIAGNOSIS — O480 Post-term pregnancy: Secondary | ICD-10-CM | POA: Diagnosis present

## 2020-12-29 DIAGNOSIS — Z975 Presence of (intrauterine) contraceptive device: Secondary | ICD-10-CM

## 2020-12-29 DIAGNOSIS — Z20822 Contact with and (suspected) exposure to covid-19: Secondary | ICD-10-CM | POA: Diagnosis present

## 2020-12-29 DIAGNOSIS — Z30430 Encounter for insertion of intrauterine contraceptive device: Secondary | ICD-10-CM | POA: Diagnosis not present

## 2020-12-29 DIAGNOSIS — Z3A41 41 weeks gestation of pregnancy: Secondary | ICD-10-CM

## 2020-12-29 HISTORY — DX: Other specified health status: Z78.9

## 2020-12-29 HISTORY — DX: 41 weeks gestation of pregnancy: O48.0

## 2020-12-29 LAB — TYPE AND SCREEN
ABO/RH(D): AB POS
Antibody Screen: NEGATIVE

## 2020-12-29 LAB — CBC
HCT: 37.1 % (ref 36.0–46.0)
Hemoglobin: 12.5 g/dL (ref 12.0–15.0)
MCH: 30.6 pg (ref 26.0–34.0)
MCHC: 33.7 g/dL (ref 30.0–36.0)
MCV: 90.9 fL (ref 80.0–100.0)
Platelets: 186 10*3/uL (ref 150–400)
RBC: 4.08 MIL/uL (ref 3.87–5.11)
RDW: 12.3 % (ref 11.5–15.5)
WBC: 11.3 10*3/uL — ABNORMAL HIGH (ref 4.0–10.5)
nRBC: 0 % (ref 0.0–0.2)

## 2020-12-29 LAB — RESP PANEL BY RT-PCR (FLU A&B, COVID) ARPGX2
Influenza A by PCR: NEGATIVE
Influenza B by PCR: NEGATIVE
SARS Coronavirus 2 by RT PCR: NEGATIVE

## 2020-12-29 LAB — RPR: RPR Ser Ql: NONREACTIVE

## 2020-12-29 MED ORDER — OXYTOCIN-SODIUM CHLORIDE 30-0.9 UT/500ML-% IV SOLN
1.0000 m[IU]/min | INTRAVENOUS | Status: DC
  Administered 2020-12-29: 2 m[IU]/min via INTRAVENOUS
  Filled 2020-12-29: qty 500

## 2020-12-29 MED ORDER — COCONUT OIL OIL: 1.0000 "application " | TOPICAL_OIL | Status: DC | PRN

## 2020-12-29 MED ORDER — ONDANSETRON HCL 4 MG PO TABS: 4.0000 mg | ORAL_TABLET | ORAL | Status: DC | PRN

## 2020-12-29 MED ORDER — SOD CITRATE-CITRIC ACID 500-334 MG/5ML PO SOLN
30.0000 mL | ORAL | Status: DC | PRN
Start: 2020-12-29 — End: 2020-12-29

## 2020-12-29 MED ORDER — SIMETHICONE 80 MG PO CHEW: 80.0000 mg | CHEWABLE_TABLET | ORAL | Status: DC | PRN

## 2020-12-29 MED ORDER — OXYTOCIN-SODIUM CHLORIDE 30-0.9 UT/500ML-% IV SOLN
2.5000 [IU]/h | INTRAVENOUS | Status: DC
  Administered 2020-12-29: 2.5 [IU]/h via INTRAVENOUS

## 2020-12-29 MED ORDER — ACETAMINOPHEN 325 MG PO TABS: 650.0000 mg | ORAL_TABLET | ORAL | Status: DC | PRN

## 2020-12-29 MED ORDER — ONDANSETRON HCL 4 MG/2ML IJ SOLN: 4.0000 mg | Freq: Four times a day (QID) | INTRAMUSCULAR | Status: DC | PRN

## 2020-12-29 MED ORDER — TRANEXAMIC ACID-NACL 1000-0.7 MG/100ML-% IV SOLN
INTRAVENOUS | Status: AC
  Filled 2020-12-29: qty 100

## 2020-12-29 MED ORDER — TERBUTALINE SULFATE 1 MG/ML IJ SOLN
0.2500 mg | Freq: Once | INTRAMUSCULAR | Status: DC | PRN
Start: 2020-12-29 — End: 2020-12-29

## 2020-12-29 MED ORDER — DIPHENHYDRAMINE HCL 25 MG PO CAPS: 25.0000 mg | ORAL_CAPSULE | Freq: Four times a day (QID) | ORAL | Status: DC | PRN

## 2020-12-29 MED ORDER — MEASLES, MUMPS & RUBELLA VAC IJ SOLR: 0.5000 mL | Freq: Once | INTRAMUSCULAR | Status: DC

## 2020-12-29 MED ORDER — ACETAMINOPHEN 325 MG PO TABS
650.0000 mg | ORAL_TABLET | ORAL | Status: DC | PRN
  Administered 2020-12-30 (×2): 650 mg via ORAL
  Filled 2020-12-29 (×2): qty 2

## 2020-12-29 MED ORDER — LIDOCAINE HCL (PF) 1 % IJ SOLN: 30.0000 mL | INTRAMUSCULAR | Status: DC | PRN

## 2020-12-29 MED ORDER — FENTANYL CITRATE (PF) 100 MCG/2ML IJ SOLN: 100.0000 ug | INTRAMUSCULAR | Status: DC | PRN

## 2020-12-29 MED ORDER — PHENYLEPHRINE 40 MCG/ML (10ML) SYRINGE FOR IV PUSH (FOR BLOOD PRESSURE SUPPORT): 80.0000 ug | PREFILLED_SYRINGE | INTRAVENOUS | Status: DC | PRN

## 2020-12-29 MED ORDER — TRANEXAMIC ACID-NACL 1000-0.7 MG/100ML-% IV SOLN
1000.0000 mg | Freq: Once | INTRAVENOUS | Status: AC
  Administered 2020-12-29: 1000 mg via INTRAVENOUS

## 2020-12-29 MED ORDER — EPHEDRINE 5 MG/ML INJ: 10.0000 mg | INTRAVENOUS | Status: DC | PRN

## 2020-12-29 MED ORDER — IBUPROFEN 600 MG PO TABS
600.0000 mg | ORAL_TABLET | Freq: Four times a day (QID) | ORAL | Status: DC
  Administered 2020-12-29 – 2020-12-30 (×4): 600 mg via ORAL
  Filled 2020-12-29 (×4): qty 1

## 2020-12-29 MED ORDER — ONDANSETRON HCL 4 MG/2ML IJ SOLN: 4.0000 mg | INTRAMUSCULAR | Status: DC | PRN

## 2020-12-29 MED ORDER — SENNOSIDES-DOCUSATE SODIUM 8.6-50 MG PO TABS
2.0000 | ORAL_TABLET | Freq: Every day | ORAL | Status: DC
  Administered 2020-12-30: 2 via ORAL
  Filled 2020-12-29: qty 2

## 2020-12-29 MED ORDER — LACTATED RINGERS IV SOLN: 500.0000 mL | INTRAVENOUS | Status: DC | PRN

## 2020-12-29 MED ORDER — DIBUCAINE (PERIANAL) 1 % EX OINT: 1.0000 "application " | TOPICAL_OINTMENT | CUTANEOUS | Status: DC | PRN

## 2020-12-29 MED ORDER — TETANUS-DIPHTH-ACELL PERTUSSIS 5-2.5-18.5 LF-MCG/0.5 IM SUSY: 0.5000 mL | PREFILLED_SYRINGE | Freq: Once | INTRAMUSCULAR | Status: DC

## 2020-12-29 MED ORDER — DIPHENHYDRAMINE HCL 50 MG/ML IJ SOLN
12.5000 mg | INTRAMUSCULAR | Status: DC | PRN
Start: 2020-12-29 — End: 2020-12-29
  Administered 2020-12-29: 12.5 mg via INTRAVENOUS
  Filled 2020-12-29: qty 1

## 2020-12-29 MED ORDER — LACTATED RINGERS IV SOLN: 500.0000 mL | Freq: Once | INTRAVENOUS | Status: DC

## 2020-12-29 MED ORDER — LACTATED RINGERS IV SOLN
INTRAVENOUS | Status: DC
  Administered 2020-12-29: 200 mL via INTRAVENOUS

## 2020-12-29 MED ORDER — FENTANYL-BUPIVACAINE-NACL 0.5-0.125-0.9 MG/250ML-% EP SOLN
12.0000 mL/h | EPIDURAL | Status: DC | PRN
  Administered 2020-12-29: 12 mL/h via EPIDURAL
  Filled 2020-12-29: qty 250

## 2020-12-29 MED ORDER — PRENATAL MULTIVITAMIN CH
1.0000 | ORAL_TABLET | Freq: Every day | ORAL | Status: DC
  Administered 2020-12-30: 1 via ORAL
  Filled 2020-12-29: qty 1

## 2020-12-29 MED ORDER — BENZOCAINE-MENTHOL 20-0.5 % EX AERO: 1.0000 "application " | INHALATION_SPRAY | CUTANEOUS | Status: DC | PRN

## 2020-12-29 MED ORDER — WITCH HAZEL-GLYCERIN EX PADS: 1.0000 "application " | MEDICATED_PAD | CUTANEOUS | Status: DC | PRN

## 2020-12-29 MED ORDER — OXYCODONE-ACETAMINOPHEN 5-325 MG PO TABS: 2.0000 | ORAL_TABLET | ORAL | Status: DC | PRN

## 2020-12-29 MED ORDER — OXYCODONE-ACETAMINOPHEN 5-325 MG PO TABS: 1.0000 | ORAL_TABLET | ORAL | Status: DC | PRN

## 2020-12-29 MED ORDER — LIDOCAINE HCL (PF) 1 % IJ SOLN
INTRAMUSCULAR | Status: DC | PRN
  Administered 2020-12-29 (×2): 4 mL via EPIDURAL

## 2020-12-29 MED ORDER — OXYTOCIN BOLUS FROM INFUSION
333.0000 mL | Freq: Once | INTRAVENOUS | Status: AC
  Administered 2020-12-29: 333 mL via INTRAVENOUS

## 2020-12-29 MED ORDER — LEVONORGESTREL 20.1 MCG/DAY IU IUD
1.0000 | INTRAUTERINE_SYSTEM | Freq: Once | INTRAUTERINE | Status: AC
  Administered 2020-12-29: 1 via INTRAUTERINE
  Filled 2020-12-29: qty 1

## 2020-12-29 NOTE — H&P (Signed)
OBSTETRIC ADMISSION HISTORY AND PHYSICAL  Nicole Morse is a 25 y.o. female [phone] with IUP at [redacted]w[redacted]d by early [place] presenting for IOL for postdates. She reports +FMs, no LOF, no VB, and no contractions thus far.  She plans on breast and bottle feeding. She requests ppIUD for birth control.  She received her prenatal care at  [facility].    Dating: By [place] --->  Estimated Date of Delivery: 12/20/20  Sono:   @[redacted]w[redacted]d , CWD, normal anatomy, cephalic presentation, anterior placental lie, 1164 g, 33% EFW  Prenatal History/Complications: None  Past Medical History: Past Medical History:  Diagnosis Date   Nonimmune to hepatitis B virus    Past Surgical History: Past Surgical History:  Procedure Laterality Date   None     Obstetrical History: OB History     Gravida  5   Para  4   Term  4   Preterm      AB      Living  4      SAB      IAB      Ectopic      Multiple      Live Births           Obstetric Comments  Four term deliveries in          Social History Social History   Socioeconomic History   Marital status: Married    Spouse name: Aleese Kamps   Number of children: 4   Years of education: Not on file   Highest education level: 3rd grade  Occupational History   Occupation: Homemaker  Tobacco Use   Smoking status: Never   Smokeless tobacco: Never  Vaping Use   Vaping Use: Never used  Substance and Sexual Activity   Alcohol use: Never   Drug use: Never   Sexual activity: Yes    Birth control/protection: I.U.D.    Comment: Interested in condoms and ovulation awareness  Other Topics Concern   Not on file  Social History Narrative   Refugee SIV. Moved to [facility] Sept. 2021 with husband and four children. Husband worked with 11-06-1972 military in [place].    Social Determinants of Health   Financial Resource Strain: Not on file  Food Insecurity: Not on file  Transportation Needs: Not on file  Physical Activity: Not on file   Stress: Not on file  Social Connections: Not on file    Family History: Family History  Problem Relation Age of Onset   Hypertension Mother    GER disease Mother     Allergies: No Known Allergies  Medications Prior to Admission  Medication Sig Dispense Refill Last Dose   acetaminophen (TYLENOL) 650 MG CR tablet TAKE 1 TABLET BY MOUTH EVERY 8 HOURS AS NEEDED FOR PAIN. 60 tablet 3 More than a month   ondansetron (ZOFRAN ODT) 4 MG disintegrating tablet Take 1 tablet (4 mg total) by mouth every 8 (eight) hours as needed for nausea or vomiting. 10 tablet 0 More than a month   prenatal vitamin w/FE, FA (NATACHEW) 29-1 MG CHEW chewable tablet Chew 1 tablet by mouth daily at 12 noon. 30 tablet 11 More than a month     Review of Systems  All systems reviewed and negative except as stated in HPI  Blood pressure 112/65, pulse 74, temperature 98.6 F (37 C), temperature source Oral, resp. rate (!) 22, height 5\' 6"  (1.676 m), weight 69 kg, SpO2 98 %.  General appearance: alert, cooperative, and no distress  Lungs: normal work of breathing on room air  Heart: normal rate, warm and well perfused Abdomen: soft, non-tender, gravid Extremities: no LE edema or calf tenderness to palpation   Presentation: Cephalic Fetal monitoring: Baseline 140 bpm, moderate variability, + accels, occasional variable decels Uterine activity: Intermittent contractions  Dilation: 2 Effacement (%): 50 Exam by:: Londell Moh, RN   Prenatal labs: ABO, Rh: --/--/AB POS (09/30 5643) Antibody: NEG (09/30 0910) Rubella: 6.48 (05/05 1339) RPR: Non Reactive (07/26 1034)  HBsAg: Negative (01/04 1139)  HIV: Non Reactive (07/26 1034)  GBS: Negative/-- (08/31 1132)  1 hr Glucola 159; 3 hour GTT normal  Genetic screening declined  Anatomy US normal  Prenatal Transfer Tool  Maternal Diabetes: No Genetic Screening: Declined Maternal Ultrasounds/Referrals: Normal Fetal Ultrasounds or other Referrals:   None Maternal Substance Abuse:  No Significant Maternal Medications:  None Significant Maternal Lab Results: Group B Strep negative  Results for orders placed or performed during the hospital encounter of 12/29/20 (from the past 24 hour(s))  Type and screen   Collection Time: 12/29/20  9:10 AM  Result Value Ref Range   ABO/RH(D) AB POS    Antibody Screen NEG    Sample Expiration      01/01/2021,2359 Performed at [facility], [address], [place], [place] [address]   CBC   Collection Time: 12/29/20  9:12 AM  Result Value Ref Range   WBC 11.3 (H) 4.0 - 10.5 K/uL   RBC 4.08 3.87 - 5.11 MIL/uL   Hemoglobin 12.5 12.0 - 15.0 g/dL   HCT 88.4 16.6 - 06.3 %   MCV 90.9 80.0 - 100.0 fL   MCH 30.6 26.0 - 34.0 pg   MCHC 33.7 30.0 - 36.0 g/dL   RDW 01.6 01.0 - 93.2 %   Platelets 186 150 - 400 K/uL   nRBC 0.0 0.0 - 0.2 %  Resp Panel by RT-PCR (Flu A&B, Covid) Nasopharyngeal Swab   Collection Time: 12/29/20  9:51 AM   Specimen: Nasopharyngeal Swab; Nasopharyngeal(NP) swabs in vial transport medium  Result Value Ref Range   SARS Coronavirus 2 by RT PCR NEGATIVE NEGATIVE   Influenza A by PCR NEGATIVE NEGATIVE   Influenza B by PCR NEGATIVE NEGATIVE    Patient Active Problem List   Diagnosis Date Noted   Post term pregnancy at [redacted] weeks gestation 12/29/2020   Abnormal glucose tolerance test (GTT) 09/14/2020   Lightheadedness 08/19/2020   Poor weight gain of pregnancy, second trimester 08/05/2020   Supervision of normal pregnancy 06/29/2020   Tooth pain 04/21/2020   Refugee health examination 04/04/2020    Assessment/Plan:  Nicole Morse is a 25 y.o. [id number] at [redacted]w[redacted]d here for IOL due to postdates.  #Labor: Will start Pitocin 2x2 and plan for AROM next check.  #Pain:  Planning for epidural  #FWB: Cat 2 due to occasional variables. Reassuring variability and continues to have accels.   #ID:  GBS neg #MOF: Breast/Bottle  #MOC: ppIUD Penni Bombard); consented verbally at bedside  and written consent signed   #Hep B non-immune: Plan for vaccination postpartum  #Language barrier: In-person interpreter used for entirety of encounter   Worthy Rancher, MD  12/29/2020, 11:49 AM

## 2020-12-29 NOTE — Anesthesia Procedure Notes (Signed)
Epidural Patient location during procedure: OB Start time: 12/29/2020 10:59 AM End time: 12/29/2020 11:02 AM  Staffing Anesthesiologist: Kaylyn Layer, MD Performed: anesthesiologist   Preanesthetic Checklist Completed: patient identified, IV checked, risks and benefits discussed, monitors and equipment checked, pre-op evaluation and timeout performed  Epidural Patient position: sitting Prep: DuraPrep and site prepped and draped Patient monitoring: continuous pulse ox, blood pressure and heart rate Approach: midline Location: L3-L4 Injection technique: LOR air  Needle:  Needle type: Tuohy  Needle gauge: 17 G Needle length: 9 cm Needle insertion depth: 4 cm Catheter type: closed end flexible Catheter size: 19 Gauge Catheter at skin depth: 9 cm Test dose: negative and Other (1% lidocaine)  Assessment Events: blood not aspirated, injection not painful, no injection resistance, no paresthesia and negative IV test  Additional Notes Patient identified. Risks, benefits, and alternatives discussed with patient including but not limited to bleeding, infection, nerve damage, paralysis, failed block, incomplete pain control, headache, blood pressure changes, nausea, vomiting, reactions to medication, itching, and postpartum back pain. Confirmed with bedside nurse the patient's most recent platelet count. Confirmed with patient that they are not currently taking any anticoagulation, have any bleeding history, or any family history of bleeding disorders. Patient expressed understanding and wished to proceed. All questions were answered. Sterile technique was used throughout the entire procedure. Please see nursing notes for vital signs.   Crisp LOR on first pass. Test dose was given through epidural catheter and negative prior to continuing to dose epidural or start infusion. Warning signs of high block given to the patient including shortness of breath, tingling/numbness in hands, complete  motor block, or any concerning symptoms with instructions to call for help. Patient was given instructions on fall risk and not to get out of bed. All questions and concerns addressed with instructions to call with any issues or inadequate analgesia.  Reason for block:procedure for pain

## 2020-12-29 NOTE — Anesthesia Preprocedure Evaluation (Signed)
Anesthesia Evaluation  Patient identified by MRN, date of birth, ID band Patient awake    Reviewed: Allergy & Precautions, Patient's Chart, lab work & pertinent test results  History of Anesthesia Complications Negative for: history of anesthetic complications  Airway Mallampati: II  TM Distance: >3 FB Neck ROM: Full    Dental no notable dental hx.    Pulmonary neg pulmonary ROS,    Pulmonary exam normal        Cardiovascular negative cardio ROS Normal cardiovascular exam     Neuro/Psych negative neurological ROS  negative psych ROS   GI/Hepatic negative GI ROS, Neg liver ROS,   Endo/Other  negative endocrine ROS  Renal/GU negative Renal ROS  negative genitourinary   Musculoskeletal negative musculoskeletal ROS (+)   Abdominal   Peds  Hematology negative hematology ROS (+)   Anesthesia Other Findings Day of surgery medications reviewed with patient.  Reproductive/Obstetrics (+) Pregnancy                             Anesthesia Physical Anesthesia Plan  ASA: 2  Anesthesia Plan: Epidural   Post-op Pain Management:    Induction:   PONV Risk Score and Plan: Treatment may vary due to age or medical condition  Airway Management Planned: Natural Airway  Additional Equipment:   Intra-op Plan:   Post-operative Plan:   Informed Consent: I have reviewed the patients History and Physical, chart, labs and discussed the procedure including the risks, benefits and alternatives for the proposed anesthesia with the patient or authorized representative who has indicated his/her understanding and acceptance.     Interpreter used for interveiw  Plan Discussed with:   Anesthesia Plan Comments:        Anesthesia Quick Evaluation  

## 2020-12-29 NOTE — Lactation Note (Addendum)
This note was copied from a baby's chart. Lactation Consultation Note  Patient Name: Nicole Morse DGLOV'F Date: 12/29/2020 Reason for consult: L&D Initial assessment;Mother's request;Term Age:26 hours  LC assisted with latching infant in cross cradle. Mom experienced with breastfeeding.  Mom to receive further LC support on the floor.  All questions answered at the end of the visit.   Maternal Data    Feeding Mother's Current Feeding Choice: Breast Milk  LATCH Score Latch: Repeated attempts needed to sustain latch, nipple held in mouth throughout feeding, stimulation needed to elicit sucking reflex.  Audible Swallowing: Spontaneous and intermittent  Type of Nipple: Everted at rest and after stimulation  Comfort (Breast/Nipple): Soft / non-tender  Hold (Positioning): Assistance needed to correctly position infant at breast and maintain latch.  LATCH Score: 8   Lactation Tools Discussed/Used    Interventions Interventions: Breast feeding basics reviewed;Assisted with latch;Skin to skin;Breast compression;Adjust position;Support pillows;Education  Discharge    Consult Status Consult Status: Follow-up from L&D Date: 12/30/20 Follow-up type: In-patient    [name]  [name] 12/29/2020, 4:51 PM

## 2020-12-29 NOTE — Progress Notes (Signed)
Labor Progress Note Nicole Morse is a 25 y.o. [id number] at [redacted]w[redacted]d who presented for postdates IOL.  S: Doing well. No concerns. Comfortable with epidural.   O:  BP (!) 94/54   Pulse 95   Temp 97.9 F (36.6 C) (Oral)   Resp (!) 22   Ht 5\' 6"  (1.676 m)   Wt 69 kg   SpO2 98%   BMI 24.55 kg/m   CVE: Dilation: 4 Effacement (%): 50 Station: -2 Presentation: Vertex Exam by:: Dr. [email or web address]   A&P: 25 y.o. 30 [redacted]w[redacted]d   #Labor: Progressing well. AROM performed this check with clear fluid. Will continue Pitocin and reassess in 3-4 hours; sooner if patient starts to feel pressure given quick labor previously.  #Pain: Epidural #FWB: Cat 1  #GBS negative  [redacted]w[redacted]d, MD 1:16 PM

## 2020-12-29 NOTE — Discharge Summary (Addendum)
Postpartum Discharge Summary   Patient Name: Nicole Morse DOB: [birth year] MRN: [id number]  Date of admission: 12/29/2020 Delivery date:12/29/2020  Delivering provider: Genia Del  Date of discharge: 12/30/2020  Admitting diagnosis: Post term pregnancy at [redacted] weeks gestation [O48.0, Z3A.41] Intrauterine pregnancy: [redacted]w[redacted]d     Secondary diagnosis:  Principal Problem:   Vaginal delivery Active Problems:   Post term pregnancy at [redacted] weeks gestation   IUD (intrauterine device) in place   Encounter for IUD insertion  Additional problems: Hepatitis B non-immune    Discharge diagnosis: Term Pregnancy Delivered                                              Post partum procedures: ppIUD insertion (Liletta) Augmentation: AROM and Pitocin Complications: None  Hospital course: Induction of Labor With Vaginal Delivery   25 y.o. yo O1L5726 at [redacted]w[redacted]d was admitted to the hospital 12/29/2020 for induction of labor.  Indication for induction: Postdates.  Patient had an uncomplicated labor course as follows: Membrane Rupture Time/Date: 12:55 PM ,12/29/2020   Delivery Method:Vaginal, Spontaneous  Episiotomy: None  Lacerations:  None  Details of delivery can be found in separate delivery note.  Patient had a routine postpartum course. Patient is discharged home 12/30/20.  Newborn Data: Birth date:12/29/2020  Birth time:4:17 PM  Gender:Female  Living status:Living  Apgars:9 ,9  Weight:3609 g   Magnesium Sulfate received: No BMZ received: No Rhophylac: N/A MMR: N/A - Immune  T-DaP: Given prenatally Flu: No Transfusion: No  Physical exam  Vitals:   12/29/20 2000 12/29/20 2325 12/30/20 0510 12/30/20 0900  BP: 113/69 112/67 (!) 109/54 (!) 96/48  Pulse: 79 80 64 85  Resp: $Remo'17 18 17 18  'fGVGk$ Temp: 98.4 F (36.9 C) 98.1 F (36.7 C) 98.2 F (36.8 C) 97.9 F (36.6 C)  TempSrc: Oral Oral Oral Oral  SpO2: 100% 100% 100% 100%  Weight:      Height:       General: alert, cooperative, and  no distress Lochia: appropriate Uterine Fundus: firm Incision: N/A DVT Evaluation: no LE edema or calf tenderness to palpation   Labs: Lab Results  Component Value Date   WBC 11.3 (H) 12/29/2020   HGB 12.5 12/29/2020   HCT 37.1 12/29/2020   MCV 90.9 12/29/2020   PLT 186 12/29/2020   CMP Latest Ref Rng & Units 04/21/2020  Glucose 65 - 99 mg/dL 80  BUN 6 - 20 mg/dL 8  Creatinine 0.57 - 1.00 mg/dL 0.53(L)  Sodium 134 - 144 mmol/L 136  Potassium 3.5 - 5.2 mmol/L 4.3  Chloride 96 - 106 mmol/L 102  CO2 20 - 29 mmol/L 22  Calcium 8.7 - 10.2 mg/dL 9.2  Total Protein 6.0 - 8.5 g/dL -  Total Bilirubin 0.0 - 1.2 mg/dL -  Alkaline Phos 44 - 121 IU/L -  AST 0 - 40 IU/L -  ALT 0 - 32 IU/L -   Edinburgh Score: Edinburgh Postnatal Depression Scale Screening Tool 12/30/2020  I have been able to laugh and see the funny side of things. 0  I have looked forward with enjoyment to things. 0  I have blamed myself unnecessarily when things went wrong. 0  I have been anxious or worried for no good reason. 0  I have felt scared or panicky for no good reason. 0  Things have been getting on  top of me. 0  I have been so unhappy that I have had difficulty sleeping. 0  I have felt sad or miserable. 0  I have been so unhappy that I have been crying. 0  The thought of harming myself has occurred to me. 0  Edinburgh Postnatal Depression Scale Total 0     After visit meds:  Allergies as of 12/30/2020   No Known Allergies      Medication List     TAKE these medications    acetaminophen 650 MG CR tablet Commonly known as: TYLENOL TAKE 1 TABLET BY MOUTH EVERY 8 HOURS AS NEEDED FOR PAIN.   ibuprofen 600 MG tablet Commonly known as: ADVIL Take 1 tablet (600 mg total) by mouth every 6 (six) hours.   ondansetron 4 MG disintegrating tablet Commonly known as: Zofran ODT Take 1 tablet (4 mg total) by mouth every 8 (eight) hours as needed for nausea or vomiting.   prenatal vitamin w/FE, FA 29-1  MG Chew chewable tablet Chew 1 tablet by mouth daily at 12 noon.         Discharge home in stable condition Infant Feeding: Bottle and Breast Infant Disposition: home with mother Discharge instruction: per After Visit Summary and Postpartum booklet. Activity: Advance as tolerated. Pelvic rest for 6 weeks.  Diet: routine diet Future Appointments:No future appointments. Follow up Visit: Message sent to [facility] by Dr. Gwenlyn Perking on 12/29/20.  Please schedule this patient for a In person postpartum visit in 4 weeks with the following provider: Any provider. Additional Postpartum F/U: IUD string check , Hep B vaccine needed Low risk pregnancy complicated by:  None Delivery mode:  Vaginal, Spontaneous  Anticipated Birth Control:  PP IUD placed   12/30/2020 [name] [name]. PGY-1, Faculty Service   GME ATTESTATION:  I saw and evaluated the patient. I agree with the findings and the plan of care as documented in the resident's note. I have made changes to documentation as necessary.  Vilma Meckel, MD OB Fellow, [facility] [place] 12/30/2020 5:56 PM

## 2020-12-30 DIAGNOSIS — Z30430 Encounter for insertion of intrauterine contraceptive device: Secondary | ICD-10-CM | POA: Diagnosis not present

## 2020-12-30 MED ORDER — IBUPROFEN 600 MG PO TABS
600.0000 mg | ORAL_TABLET | Freq: Four times a day (QID) | ORAL | 0 refills | Status: DC
Start: 2020-12-30 — End: 2022-08-06

## 2020-12-30 NOTE — Progress Notes (Addendum)
Patient ID: Ofelia Podolski, female   DOB: 10-May-1995, 25 y.o.   MRN: [id number]   Coffee Work  12/30/2020  Girl Karinne Schmader 12/29/2020 [id number]  [place] acknowledges that MOB does not have a crib or safe place for infant to sleep.  Approval given from [place], Denyse Amass Scinto to obtain a pack 'n play from the [employer] on the 1st floor at the [facility]).  CSW was provided the name and contact number for Cephus Shelling, [facility] with [facility], who was able to explain the process for obtaining the pack n' play.  CSW contacted Golda Acre, House Coverage at [facility], who was able to obtain the pack n' play from the [employer] and deliver directly to MOB's room.  CSW contacted FOB, to ensure that the pack n' play was delivered, but also to confirm that no additional social work needs had been identified.  CSW will sign off.    (Late Entry) Addendum:   CSW received a secure chat message from [place], RN, (at 6:29 pm on 12/30/2020), indicating that MOB was ready for discharge home and needed for CSW to arrange transportation.  Marissa further indicated that MOB verbalized to her that "we" [facility] staff) always help her with a ride home from the hospital, as well as to and from physician appointments.  CSW is aware that [facility] is only available until 3:00pm on weekends, and FOB also needed transport.  While in the process of trying to arrange transportation, CSW received a text message from Boris Lown, [place] on call (at 7:01 pm on 12/30/2000), indicating that she had received 2 calls from [facility] staff, stating that MOB reported not having a car seat for infant to safely transport home.   CSW contacted FOB to explain that there would be a $30.00 fee for the car seat.  FOB reported that he only had a credit card.  CSW encouraged FOB to go to the ATM to withdrawal cash, providing  location, in exchange for the car seat.  FOB assured CSW that he, MOB and infant all had transportation arranged to return home, they were just waiting on the car seat.  CSW explained to Marissa that the car seat would be delivered to the nurses station on the unit and that FOB and MOB would be able to obtain the car seat as soon as FOB provided the $30.00 in cash.  CSW received a text message from [employer] (at 8:52pm on 12/30/2020) requesting an update.  CSW responded to the request (at 9:16pm on 12/30/2020), via text, confirming that all arrangements had been made and were in place for MOB, FOB and infant to safely discharge from the hospital.  Nat Christen, BSW, MSW, LCSW  Licensed Clinical Social Worker  [employer]  Mailing [place]. [address], [place], [place] [address] Physical Address-[address] [place], [place] [address] Toll Free Main # [phone] Fax # [phone] Cell # [phone]  [email or web address]

## 2020-12-30 NOTE — Anesthesia Postprocedure Evaluation (Signed)
Anesthesia Post Note  Patient: Vinessa Macconnell  Procedure(s) Performed: AN AD HOC LABOR EPIDURAL     Patient location during evaluation: Mother Baby Anesthesia Type: Epidural Level of consciousness: awake and alert Pain management: pain level controlled Vital Signs Assessment: post-procedure vital signs reviewed and stable Respiratory status: spontaneous breathing Cardiovascular status: stable Postop Assessment: no headache, adequate PO intake, no backache, patient able to bend at knees, able to ambulate, epidural receding and no apparent nausea or vomiting Anesthetic complications: no   No notable events documented.  Last Vitals:  Vitals:   12/30/20 0510 12/30/20 0900  BP: (!) 109/54 (!) 96/48  Pulse: 64 85  Resp: 17 18  Temp: 36.8 C 36.6 C  SpO2: 100% 100%    Last Pain:  Vitals:   12/30/20 1212  TempSrc:   PainSc: 3    Pain Goal:                   Salome Arnt

## 2020-12-30 NOTE — Progress Notes (Signed)
RN used [employer] interpreter services Pashto interpreter to perform the assessment this morning.  Patient voiced concerns that she was still experiencing some abdominal and back pain intermittently.  RN educated her that this was normal postpartum cramping.  RN also encouraged patient to get up and ambulate to help the back pain.    Patient's blood pressure was slightly lower than her normal has been (96/48), she does not report any lightheadedness or dizziness.  RN educated that if she felt any lightheadedness or dizziness to let the RN know and not to get up.  RN also notified the MD.  All of the patient's questions were answered.  RN will continue to monitor.  Clell Trahan, [place]

## 2020-12-30 NOTE — Progress Notes (Signed)
POSTPARTUM PROGRESS NOTE  Post Partum Day 1  Subjective:  Nicole Morse is a 25 y.o. [id number] s/p VD at [redacted]w[redacted]d.  She reports she is doing well. No acute events overnight. She denies any problems with ambulating, voiding or po intake. Denies nausea or vomiting.  Pain is well controlled.  Lochia is mild.  Objective: Blood pressure (!) 109/54, pulse 64, temperature 98.2 F (36.8 C), temperature source Oral, resp. rate 17, height 5\' 6"  (1.676 m), weight 69 kg, SpO2 100 %, unknown if currently breastfeeding.  Physical Exam:  General: alert, cooperative and no distress Chest: no respiratory distress Heart:regular rate, distal pulses intact Uterine Fundus: firm, appropriately tender DVT Evaluation: No calf swelling or tenderness Extremities: no edema Skin: warm, dry  Recent Labs    12/29/20 0912  HGB 12.5  HCT 37.1    Assessment/Plan: Nicole Morse is a 25 y.o. 30 s/p VD at [redacted]w[redacted]d   PPD#1 - Doing well  Routine postpartum care  Hep B non-immune: Needs Hep B vaccine postpartum at pp visit.   Contraception: ppIUD placed  Feeding: breast/bottle Dispo: Patient would like to dc today, pending infant disposition. 24 hours post-delivery would be later this afternoon at the earliest.    LOS: 1 day   [redacted]w[redacted]d, DO  OB Fellow  12/30/2020, 8:22 AM

## 2020-12-30 NOTE — Lactation Note (Signed)
This note was copied from a baby's chart. Lactation Consultation Note  Patient Name: Girl Roxi Hlavaty WCBJS'E Date: 12/30/2020   Age:26 hours   LC Note:  Spoke with RN: mother has been formula feeding only; will be a prn status.   Maternal Data    Feeding    LATCH Score                    Lactation Tools Discussed/Used    Interventions    Discharge    Consult Status      Harlo Fabela R Balen Woolum 12/30/2020, 2:42 PM

## 2021-01-09 ENCOUNTER — Telehealth (HOSPITAL_COMMUNITY): Payer: Self-pay | Admitting: *Deleted

## 2021-01-09 NOTE — Telephone Encounter (Signed)
Hospital discharge follow-up call completed with Language Line Interpreter [phone]. Patient voiced no questions or concerns regarding her own health. Declined EPDS, stating, "I am good. I am happy all the time. Patient asked RN when her follow-up appointment is with her OB. RN instructed patient that she needs to call to schedule that appointment. Patient reported that she has the phone number for her OB. Patient voiced no questions or concerns regarding baby at this time. Patient reported infant sleeps in a crib sometimes on her back and sometimes on her side. RN reviewed ABCs of safe sleep - patient verbalized understanding. Deforest Hoyles, RN, 01/09/21, [phone].

## 2021-01-18 ENCOUNTER — Telehealth: Payer: Self-pay

## 2021-01-18 NOTE — Telephone Encounter (Signed)
Ok thank you very much for doing that and helping with this patient.   Lavonda Jumbo, DO 01/18/2021, 5:13 PM PGY-3, [facility]

## 2021-01-18 NOTE — Telephone Encounter (Signed)
Patient calls nurse line with pashto interpreter. Patient reports she had an IUD placed after delivery at [facility] in September. Patient reports increased pain over the last few weeks and feels the strings are "hanging low." Patient denies any bleeding or abnormal discharge. Patient denies fevers or chills. Patient advised to refrain from intercourse. Patient scheduled for ATC on Monday morning. ED precautions given for in the meantime.

## 2021-01-22 ENCOUNTER — Ambulatory Visit: Payer: Medicaid Other | Admitting: Family Medicine

## 2021-01-22 ENCOUNTER — Other Ambulatory Visit: Payer: Self-pay

## 2021-01-22 ENCOUNTER — Ambulatory Visit: Payer: Medicaid Other

## 2021-01-22 DIAGNOSIS — Z23 Encounter for immunization: Secondary | ICD-10-CM

## 2021-01-22 NOTE — Patient Instructions (Signed)
2

## 2021-01-22 NOTE — Progress Notes (Signed)
  [facility] Postpartum Visit   Nicole Morse is a 25 y.o. [id number] presenting for a postpartum visit.  She has the following concerns today: IUD strings are too long and she would like them cut.  She delivered via VD at 12/29/20.  She reports her vaginal bleeding is ok. She is breast feeding her infant. She feels she is bonding well. She is considering IUD for contraception.   Edinburgh Postnatal Depression Scale: Unable to achieve. Printed pashto form but patient is unable to read. Husband offered to fill it out but we decided against as it may not be accurate. Asked husband to read it to patient. He did not do this prior to end of visit. Patient denied depression.  Reviewed pregnancy and delivery course.   Vitals:   01/22/21 0915  BP: 103/69  Pulse: 66  SpO2: 99%   Exam:  General: Appears well, no acute distress. Age appropriate. Respiratory: normal effort Pelvic exam: IUD strings visible at introitus. VULVA: normal appearing vulva with no masses, tenderness or lesions, VAGINA: normal appearing vagina with normal color and discharge, no lesions, CERVIX: normal appearing cervix without discharge or lesions, IUD not visualized at the end of cervix; only string visible, exam chaperoned by Gilberto Better, CMA.  Extremities: No edema or cyanosis. Skin: Warm and dry, no rashes noted Neuro: alert and oriented Psych: normal affect  A/P:  Postpartum visit: patient is 4 weeks postpartum following a VD delivery. -Patient did not have any lacerations -Patient has urinary incontinence? No -Patient is not safe to resume physical and sexual activity. Advised to wait an additional 2 weeks.  -Patient does not want a pregnancy in the next year.  -Patient has IUD; strings were cut appropriate length with lag left in vaginal to aide in removal when desired.  -Return to sexual activity and contraception discussed as above.  -Discussed birth spacing of 18 months -Breastfeeding: Yes,  provided support of decision and resources as indicated  -Mood: Very good  -Discussed sleep and fatigue management and encouraged family/community support.  -Postpartum vaccines: Hep B vaccine -Reviewed prior Pap and she is next due for Pap 2025.  -Return PRN

## 2021-03-27 ENCOUNTER — Ambulatory Visit (INDEPENDENT_AMBULATORY_CARE_PROVIDER_SITE_OTHER): Payer: Medicaid Other | Admitting: Family Medicine

## 2021-03-27 ENCOUNTER — Encounter: Payer: Self-pay | Admitting: Family Medicine

## 2021-03-27 ENCOUNTER — Ambulatory Visit (INDEPENDENT_AMBULATORY_CARE_PROVIDER_SITE_OTHER): Payer: Medicaid Other

## 2021-03-27 ENCOUNTER — Other Ambulatory Visit (HOSPITAL_COMMUNITY)
Admission: RE | Admit: 2021-03-27 | Discharge: 2021-03-27 | Disposition: A | Discharge status: Home / Self Care | Payer: Medicaid Other | Source: Ambulatory Visit | Attending: Family Medicine | Admitting: Family Medicine

## 2021-03-27 ENCOUNTER — Other Ambulatory Visit: Payer: Self-pay

## 2021-03-27 ENCOUNTER — Telehealth: Payer: Self-pay | Admitting: Family Medicine

## 2021-03-27 VITALS — BP 106/69 | HR 71 | Ht 65.0 in | Wt 145.5 lb

## 2021-03-27 DIAGNOSIS — N898 Other specified noninflammatory disorders of vagina: Secondary | ICD-10-CM

## 2021-03-27 DIAGNOSIS — Z0000 Encounter for general adult medical examination without abnormal findings: Secondary | ICD-10-CM

## 2021-03-27 DIAGNOSIS — Z23 Encounter for immunization: Secondary | ICD-10-CM | POA: Diagnosis not present

## 2021-03-27 DIAGNOSIS — K0889 Other specified disorders of teeth and supporting structures: Secondary | ICD-10-CM

## 2021-03-27 LAB — POCT WET PREP (WET MOUNT)
Clue Cells Wet Prep Whiff POC: NEGATIVE
Trichomonas Wet Prep HPF POC: ABSENT

## 2021-03-27 NOTE — Assessment & Plan Note (Signed)
Subacute, duration 1.5 months.  Patient is notably 3 months postpartum.  Low concern for infection, appears to be physiologic discharge.  Vaginal exam unremarkable.  Collected swab to screen for infection.  We will follow-up with results.

## 2021-03-27 NOTE — Telephone Encounter (Signed)
After the end of the visit today, patient's husband reports Nicole Morse is having continued dental pain. She was never able to get to a dentist. Husband reports she now needs an extraction. They are unable to make their own appointment because of their language barrier.   Brief history of our attempts to get her dental care:  05/18/20 She was turned away from her appointment at [facility] ([address], [place], [place] [address], Phone number [phone]) after a 2-hour bus commute because she did not have her own in-person interpreter. Of note, they had an interpreter available on the phone. When I later spoke on the phone to the office manager, she reported they were having trouble finding an interpretation service and that they also recently had to turn away a hearing impaired patient because they did not have an ASL interpreter.   05/19/20 She was turned away from her appointment with [facility] because she was pregnant and in her first trimester. Dr. See, returned my call and reported that she would not be able to perform diagnostic dental x-rays until the second trimester. To my knowledge, no attempt was made to obtain medical records.   06/20/20 I called [facility] to report that [name] was now in her second trimester and that she may now receive limited diagnostic dental x-rays. No answer, no option to leave voicemail.   03/27/21 [facility]: I have called and left a voicemail with [facility] clinic. Will see if they will make an exception and take her even though she is not currently pregnant.  [facility]: No answer. Answering service said they are only taking emergencies right now, did not give option to leave VM.  I plan to call dentists tomorrow until I am able to set up an appointment. I am cautiously optimistic about the success of this endeavor. Below is a brief summary of protections and  which healthcare providers are mandated as outlined in section 1557 of the ACA, which as far as I can elucidate is still in effect.   Fayette Pho, MD

## 2021-03-27 NOTE — Progress Notes (Signed)
° ° °  SUBJECTIVE:   CHIEF COMPLAINT / HPI:   Vaginal discharge postpartum - G5 P5-0-0-5 - Delivered baby #5 SVD 12/02/2020 - Postpartum visit 01/22/2021, IUD strings were cut for comfort - for the last 1.5 months, has experienced daily watery gooey discharge - pinkish, no heavy bleeding - started about two months postpartum, after the postpartum exam - no odor, fever, chills - pelvic pain previously, but has resolved, possibly due to SVD and recovery  - every day, needing to use sanitary pad for discharge  Health maintenance -Due for COVID and flu  PERTINENT  PMH / PSH:  Patient Active Problem List   Diagnosis Date Noted   IUD (intrauterine device) in place 12/29/2020   Lightheadedness 08/19/2020   Healthcare maintenance 04/21/2020   Vaginal discharge 04/21/2020   Tooth pain 04/21/2020   Refugee health examination 04/04/2020    OBJECTIVE:   BP 106/69    Pulse 71    Ht 5\' 5"  (1.651 m)    Wt 145 lb 8 oz (66 kg)    SpO2 100%    Breastfeeding Yes    BMI 24.21 kg/m   Physical Exam General: Awake, alert, oriented, no acute distress Respiratory: Normal work of breathing, no respiratory distress Neuro: Cranial nerves II through X grossly intact, able to move all extremities spontaneously Vulva: Normal appearing vulva without rashes, lesions, or deformities Vagina: Pale pink rugated vaginal tissue without obvious lesions, physiologic discharge of whitish color, cervix without lesion or overt tenderness with swab   ASSESSMENT/PLAN:   Vaginal discharge Subacute, duration 1.5 months.  Patient is notably 3 months postpartum.  Low concern for infection, appears to be physiologic discharge.  Vaginal exam unremarkable.  Collected swab to screen for infection.  We will follow-up with results.   Healthcare maintenance Patient received both flu and COVID booster today, tolerated well.     , MD [facility]

## 2021-03-27 NOTE — Patient Instructions (Signed)
I have translated the following text using Google translate.  As such, there are many errors.  I apologize for the poor written translation; however, we do not have written  translation services yet. It was wonderful to see you today. Thank you for allowing me to be a part of your care. Below is a short summary of what we discussed at your visit today: ?? ????? ??? ? ???? ????? ?? ?????? ??? ?????? ??. ?? ?? ????? ???? ???? ???? ???. ?? ? ???? ???? ??? ????? ????? ????? ?????? ?? ??????? ??? ?? ???? ? ????? ????? ??????? ?? ???. ?? ?? ??? ???? ?? ?? ?? ??? ???? ?????. ???? ?? ????? ????? ?? ????? ? ??????? ???? ??. ????? ? ??? ?? ????? ?? ?? ??? ?? ????? ?? ????? ?? ??? ???:  Vaginal discharge This is likely normal after your body recovers from giving birth.  Today we tested you for vaginal infection.If the results are normal, I will send you a letter. If the results are abnormal, I will give you a call and send in medication to the pharmacy. ? ???????? ??????? ?? ?????? ????? ?? ?????? ?? ?? ?? ????? ??? ? ????? ??? ??? ??. ?? ?? ???? ? ?????? ????? ????? ?????? ???. ?? ????? ?????? ??? ?? ?? ???? ?? ?? ??? ??????. ?? ????? ??? ?????? ??? ?? ?? ???? ?? ?????? ???? ?? ???? ?? ??????? ?? ?????.  Maternal Mental Health If you start to develop the below symptoms of depression, please reach out to [place] for an appointment. There is also a Biomedical scientist Health Hotline at [phone]). This hotline has trained counselors, doulas, and midwifes to real-time support, information, and resources.  Feeling sad or hopeless most of the time Lack of interest in things you used to enjoy Less interest in caring for yourself (dressing, fixing hair) Trouble concentrating Trouble coping with daily tasks Constant worry about your baby Sleeping or eating too much or too little Feeling very anxious or nervous Unexplained irritability or anger Unwanted or scary thoughts Feeling that you are  not a good mother Thoughts of hurting yourself or your baby  If you feel you are experiencing a mental health crisis, please reach out to the National Suicide Prevention Hotline at 1-800-273-TALK (717)169-8426) or go directly to the [facility], open 24/7.  ([phone] [address] [place], [place] [address]  ? ????? ????? ?????? ?? ???? ? ????? ????? ??? ???????? ??? ??? ??? ? ??????? ???? ? ?????? ????? ??? ??? ????? ?????. ?? [phone]) ?? ? ????? ? ????? ?????? ??? ?????? ?? ???? ???. ?? ?? ???? ? ??????? ??? ????? ? ???????? ?? ?????? ????? ??????? ? ????? ?? ????? ????? ??.  ???? ??? ? ????? ?? ?? ????? ????? ???  ? ??? ????? ??? ? ????? ??????? ?? ???? ?? ???? ?????  ? ??? ??? ?? ????? ?? ?? ??????  ?? ????? ?? ??????  ? ?????? ?????? ??? ? ?????? ??????  ? ??? ????? ?? ??? ???????? ???????  ??? ??? ?? ??? ????? ??? ?? ??? ??  ? ??? ?????? ?? ?????? ????? ???  ???????? ????? ?? ????  ??????? ?? ??????? ??????  ?? ????? ??? ?? ???? ?? ??? ?? ????  ? ??? ?? ?? ????? ????? ?? ? ???? ????? ???  ?? ???? ????? ??? ?? ???? ? ????? ?????? ????? ??? ?? ????? ??????? ???? ? 1-800-273-TALK ([phone]) ????? ?? ? ??? ???? ?????? ??? ???? ?? ???? ?? ?????? ? ??????? ?????? ???? ?????? ????? ??????? ?? ??? ??? ???? ???.  24/7. [phone] ????? ?????????? [place] [address]    If you have any questions or concerns, please do not hesitate to contact us via phone or MyChart message.  ?? ???? ???? ?????? ?? ??????? ???? ??????? ???? ? ?????? ?? MyChart ????? ?? ???? ??? ??? ????? ?????.  Fayette Pho, MD

## 2021-03-27 NOTE — Assessment & Plan Note (Signed)
Patient received both flu and COVID booster today, tolerated well.

## 2021-03-28 ENCOUNTER — Other Ambulatory Visit: Payer: Self-pay | Admitting: Family Medicine

## 2021-03-28 DIAGNOSIS — K0889 Other specified disorders of teeth and supporting structures: Secondary | ICD-10-CM

## 2021-03-28 DIAGNOSIS — Z603 Acculturation difficulty: Secondary | ICD-10-CM

## 2021-03-28 DIAGNOSIS — Z789 Other specified health status: Secondary | ICD-10-CM

## 2021-03-28 LAB — CERVICOVAGINAL ANCILLARY ONLY
Chlamydia: NEGATIVE
Comment: NEGATIVE
Comment: NEGATIVE
Comment: NORMAL
Neisseria Gonorrhea: NEGATIVE
Trichomonas: NEGATIVE

## 2021-03-28 MED ORDER — LORAZEPAM 2 MG/ML IJ SOLN
INTRAMUSCULAR | Status: AC
  Filled 2021-03-28: qty 1

## 2021-03-28 MED ORDER — LORAZEPAM 2 MG/ML IJ SOLN
INTRAMUSCULAR | Status: AC
  Filled 2021-03-28: qty 2

## 2021-03-30 ENCOUNTER — Other Ambulatory Visit: Payer: Self-pay

## 2021-03-30 NOTE — Patient Instructions (Signed)
Visit Information  [name]  - as a part of your Medicaid benefit, you are eligible for care management and care coordination services at no cost or copay. I was unable to reach you by phone today but would be happy to help you with your health related needs. Please feel free to call me @ [phone]   A member of the Managed Medicaid care management team will reach out to you again over the next 7 days.   Gus Puma, BSW, [facility]   [employer] Risk Managed Medicaid Team  [phone]

## 2021-03-30 NOTE — Patient Outreach (Signed)
Care Coordination  03/30/2021  Nicole Morse [birth year] [id number]   Medicaid Managed Care   Unsuccessful Outreach Note  03/30/2021 Name: Nicole Morse MRN: [id number] DOB: 09-Sep-1995  Referred by: Fayette Pho, MD Reason for referral : High Risk Managed Medicaid (MM Social Work Telephone Unsuccessful Telephone Outreach)   An unsuccessful telephone outreach was attempted today. The patient was referred to the case management team for assistance with care management and care coordination.   Follow Up Plan: The care management team will reach out to the patient again over the next 7 days.   Gus Puma, BSW, [facility]   [employer] Risk Managed Medicaid Team  [phone]

## 2021-04-10 ENCOUNTER — Other Ambulatory Visit: Payer: Self-pay

## 2021-04-10 NOTE — Patient Outreach (Signed)
°  Medicaid Managed Care Social Work Note  04/10/2021 Name:  Nicole Morse MRN:  [id number] DOB:  06-Mar-1996  Nicole Morse is an 26 y.o. year old female who is a primary patient of Fayette Pho, MD.  The Medicaid Managed Care Coordination team was consulted for assistance with:  Community Resources   Nicole Morse was given information about Medicaid Managed Care Coordination team services today. Nicole Morse Patient agreed to services and verbal consent obtained.  Engaged with patient  for by telephone forinitial visit in response to referral for case management and/or care coordination services.   Assessments/Interventions:  Review of past medical history, allergies, medications, health status, including review of consultants reports, laboratory and other test data, was performed as part of comprehensive evaluation and provision of chronic care management services.  SDOH: (Social Determinant of Health) assessments and interventions performed: BSW contacted and spoke with patient and her husband. Husband stated that they had some paperwork that needed to be completed but did not know what it said. They stated they do not have anyone that could help them translate the information. Patient and husband asked if BSW could find them an interpreter to assist with helping them translate. BSW will do some research to see if she can find a free interpreter and also find a dentist that has an interpreter and accepts MM.  Advanced Directives Status:  Not addressed in this encounter.  Care Plan                 No Known Allergies  Medications Reviewed Today     Reviewed by Fayette Pho, MD (Resident) on 03/27/21 at 1809  Med List Status: <None>   Medication Order Taking? Sig Documenting Provider Last Dose Status Informant  acetaminophen (TYLENOL) 650 MG CR tablet [id number] No TAKE 1 TABLET BY MOUTH EVERY 8 HOURS AS NEEDED FOR PAIN. Fayette Pho, MD More than a month Active   ibuprofen  (ADVIL) 600 MG tablet [id number]  Take 1 tablet (600 mg total) by mouth every 6 (six) hours. Allayne Stack, DO  Active   ondansetron (ZOFRAN ODT) 4 MG disintegrating tablet [id number] No Take 1 tablet (4 mg total) by mouth every 8 (eight) hours as needed for nausea or vomiting. Maury Dus, MD More than a month Active   prenatal vitamin w/FE, FA (NATACHEW) 29-1 MG CHEW chewable tablet [id number] No Chew 1 tablet by mouth daily at 12 noon. Autry-LottRanda Evens, DO More than a month Active             Patient Active Problem List   Diagnosis Date Noted   IUD (intrauterine device) in place 12/29/2020   Lightheadedness 08/19/2020   Healthcare maintenance 04/21/2020   Vaginal discharge 04/21/2020   Tooth pain 04/21/2020   Refugee health examination 04/04/2020    Conditions to be addressed/monitored per PCP order:   language barrier  There are no care plans that you recently modified to display for this patient.   Follow up:  Patient agrees to Care Plan and Follow-up.  Plan: The Managed Medicaid care management team will reach out to the patient again over the next 7 days.  Date/time of next scheduled Social Work care management/care coordination outreach:  04/19/21  Gus Puma, Kenard Gower, [facility]   [facility]  High Risk Managed Medicaid Team  [phone]

## 2021-04-10 NOTE — Patient Instructions (Signed)
Visit Information  Nicole Morse was given information about Medicaid Managed Care team care coordination services as a part of their Healthy [facility] Medicaid benefit. Nicole Morse verbally consented to engagement with the [facility] Managed Care team.   If you are experiencing a medical emergency, please call 911 or report to your local emergency department or urgent care.   If you have a non-emergency medical problem during routine business hours, please contact your provider's office and ask to speak with a nurse.   For questions related to your Healthy [facility] health plan, please call: [phone] or visit the homepage here: [email or web address]  If you would like to schedule transportation through your Healthy [facility] plan, please call the following number at least 2 days in advance of your appointment: [phone]  Call the [facility] Crisis Line at [phone], at any time, 24 hours a day, 7 days a week. If you are in danger or need immediate medical attention call 911.  If you would like help to quit smoking, call 1-800-QUIT-NOW (640-027-5615) OR Espaol: 1-855-Djelo-Ya (5-462-703-5009) o para ms informacin haga clic aqu or Text READY to 381-829 to register via text  Nicole Morse - following are the goals we discussed in your visit today:   Goals Addressed   None     Social Worker will follow up in 7 days .   Gus Puma, BSW, [facility]   [place]  High Risk Managed Medicaid Team  [phone]   Following is a copy of your plan of care:  There are no care plans that you recently modified to display for this patient.

## 2021-04-19 ENCOUNTER — Other Ambulatory Visit: Payer: Self-pay

## 2021-04-19 NOTE — Patient Instructions (Signed)
Visit Information  Ms. Qadri was given information about Medicaid Managed Care team care coordination services as a part of their Healthy [facility] Medicaid benefit. [name] verbally consented to engagement with the [facility] Managed Care team.   If you are experiencing a medical emergency, please call 911 or report to your local emergency department or urgent care.   If you have a non-emergency medical problem during routine business hours, please contact your provider's office and ask to speak with a nurse.   For questions related to your Healthy [facility] health plan, please call: [phone] or visit the homepage here: [email or web address]  If you would like to schedule transportation through your Healthy [facility] plan, please call the following number at least 2 days in advance of your appointment: [phone]  Call the [facility] Crisis Line at [phone], at any time, 24 hours a day, 7 days a week. If you are in danger or need immediate medical attention call 911.  If you would like help to quit smoking, call 1-800-QUIT-NOW (701 806 3070) OR Espaol: 1-855-Djelo-Ya (9-242-683-4196) o para ms informacin haga clic aqu or Text READY to 222-979 to register via text  Ms. Wolfgang - following are the goals we discussed in your visit today:   Goals Addressed   None      The  Patient                                              has been provided with contact information for the Managed Medicaid care management team and has been advised to call with any health related questions or concerns.   Gus Puma, BSW, [facility]   [place]  High Risk Managed Medicaid Team  [phone]   Following is a copy of your plan of care:  There are no care plans that you recently modified to display for this patient.

## 2021-04-19 NOTE — Patient Outreach (Signed)
°  Medicaid Managed Care Social Work Note  04/19/2021 Name:  Nicole Morse MRN:  [id number] DOB:  Feb 09, 1996  Nicole Morse is an 26 y.o. year old female who is a primary patient of Nicole Pho, MD.  The [facility] Managed Care Coordination team was consulted for assistance with:   language barrier  Nicole Morse was given information about Medicaid Managed Care Coordination team services today. Nicole Morse Patient agreed to services and verbal consent obtained.  Engaged with patient  for by telephone forfollow up visit in response to referral for case management and/or care coordination services.   Assessments/Interventions:  Review of past medical history, allergies, medications, health status, including review of consultants reports, laboratory and other test data, was performed as part of comprehensive evaluation and provision of chronic care management services.  SDOH: (Social Determinant of Health) assessments and interventions performed: BSW contacted patient to follow up with interpreter services, After researching BSW was unable to find any free interpreter services that will go into a dental office with patient. BSW offered to locate a dental office that offers services and patient's husband stated they have tried all of them. BSW asked if they had a sponsor or anyone they could call to help the translate and they stated they did not.    Advanced Directives Status:  Not addressed in this encounter.  Care Plan                 No Known Allergies  Medications Reviewed Today     Reviewed by Nicole Pho, MD (Resident) on 03/27/21 at 1809  Med List Status: <None>   Medication Order Taking? Sig Documenting Provider Last Dose Status Informant  acetaminophen (TYLENOL) 650 MG CR tablet [id number] No TAKE 1 TABLET BY MOUTH EVERY 8 HOURS AS NEEDED FOR PAIN. Nicole Pho, MD More than a month Active   ibuprofen (ADVIL) 600 MG tablet [id number]  Take 1 tablet (600 mg total) by  mouth every 6 (six) hours. Nicole Stack, DO  Active   ondansetron (ZOFRAN ODT) 4 MG disintegrating tablet [id number] No Take 1 tablet (4 mg total) by mouth every 8 (eight) hours as needed for nausea or vomiting. Nicole Dus, MD More than a month Active   prenatal vitamin w/FE, FA (NATACHEW) 29-1 MG CHEW chewable tablet [id number] No Chew 1 tablet by mouth daily at 12 noon. Nicole Evens, DO More than a month Active             Patient Active Problem List   Diagnosis Date Noted   IUD (intrauterine device) in place 12/29/2020   Lightheadedness 08/19/2020   Healthcare maintenance 04/21/2020   Vaginal discharge 04/21/2020   Tooth pain 04/21/2020   Refugee health examination 04/04/2020    Conditions to be addressed/monitored per PCP order:   translation services  There are no care plans that you recently modified to display for this patient.   Follow up:  Patient agrees to Care Plan and Follow-up.      Nicole Morse, BSW, [facility]   [employer] Risk Managed Medicaid Team  [phone]

## 2022-01-16 ENCOUNTER — Ambulatory Visit: Payer: Medicaid Other

## 2022-08-06 ENCOUNTER — Emergency Department (HOSPITAL_BASED_OUTPATIENT_CLINIC_OR_DEPARTMENT_OTHER)
Admission: EM | Admit: 2022-08-06 | Discharge: 2022-08-06 | Disposition: A | Discharge status: Home / Self Care | Payer: Medicaid Other | Attending: Emergency Medicine | Admitting: Emergency Medicine

## 2022-08-06 ENCOUNTER — Other Ambulatory Visit (HOSPITAL_BASED_OUTPATIENT_CLINIC_OR_DEPARTMENT_OTHER): Payer: Self-pay

## 2022-08-06 ENCOUNTER — Encounter (HOSPITAL_BASED_OUTPATIENT_CLINIC_OR_DEPARTMENT_OTHER): Payer: Self-pay | Admitting: Urology

## 2022-08-06 ENCOUNTER — Emergency Department (HOSPITAL_BASED_OUTPATIENT_CLINIC_OR_DEPARTMENT_OTHER): Payer: Medicaid Other

## 2022-08-06 ENCOUNTER — Other Ambulatory Visit: Payer: Self-pay

## 2022-08-06 DIAGNOSIS — U071 COVID-19: Secondary | ICD-10-CM | POA: Diagnosis not present

## 2022-08-06 DIAGNOSIS — R509 Fever, unspecified: Secondary | ICD-10-CM | POA: Diagnosis present

## 2022-08-06 LAB — RESP PANEL BY RT-PCR (RSV, FLU A&B, COVID)  RVPGX2
Influenza A by PCR: NEGATIVE
Influenza B by PCR: NEGATIVE
Resp Syncytial Virus by PCR: NEGATIVE
SARS Coronavirus 2 by RT PCR: POSITIVE — AB

## 2022-08-06 MED ORDER — IBUPROFEN 400 MG PO TABS
600.0000 mg | ORAL_TABLET | Freq: Once | ORAL | Status: AC
  Administered 2022-08-06: 600 mg via ORAL
  Filled 2022-08-06: qty 1

## 2022-08-06 MED ORDER — IBUPROFEN 600 MG PO TABS
600.0000 mg | ORAL_TABLET | Freq: Four times a day (QID) | ORAL | 0 refills | Status: AC | PRN
  Filled 2022-08-06: qty 20, 5d supply, fill #0

## 2022-08-06 NOTE — ED Notes (Signed)

## 2022-08-06 NOTE — Discharge Instructions (Signed)
Please read and follow all provided instructions.  Your diagnoses today include:  1. COVID-19     Tests performed today include: Vital signs. See below for your results today.  COVID test -COVID test was positive Chest x-ray: No signs of pneumonia  Medications prescribed:  Ibuprofen (Motrin, Advil) - anti-inflammatory pain medication Do not exceed 600mg  ibuprofen every 6 hours, take with food  You have been prescribed an anti-inflammatory medication or NSAID. Take with food. Take smallest effective dose for the shortest duration needed for your pain. Stop taking if you experience stomach pain or vomiting.    Take any prescribed medications only as directed. Treatment for your infection is aimed at treating the symptoms. There are no medications, such as antibiotics, that will cure your infection.   Home care instructions:  Follow any educational materials contained in this packet.   Your illness is contagious and can be spread to others, especially during the first 3 or 4 days. It cannot be cured by antibiotics or other medicines. Take basic precautions such as washing your hands often, covering your mouth when you cough or sneeze, and avoiding public places where you could spread your illness to others.   Please continue drinking plenty of fluids.  Use over-the-counter medicines as needed as directed on packaging for symptom relief.  You may also use ibuprofen or tylenol as directed on packaging for pain or fever.  Do not take multiple medicines containing Tylenol or acetaminophen to avoid taking too much of this medication.  If you are positive for Covid-19, you should isolate yourself and not be exposed to other people for 5 days after your symptoms began. If you are not feeling better at day 5, you need to isolate yourself for a total of 10 days. If you are feeling better by day 5, you should wear a mask properly, over your nose and mouth, at all times while around other people until  10 days after your symptoms started.   Follow-up instructions: Please follow-up with your primary care provider as needed for further evaluation of your symptoms if you are not feeling better.   Return instructions:  Please return to the Emergency Department if you experience worsening symptoms.  Return to the emergency department if you have worsening shortness of breath breathing or increased work of breathing, persistent vomiting RETURN IMMEDIATELY IF you develop shortness of breath, confusion or altered mental status, a new rash, become dizzy, faint, or poorly responsive, or are unable to be cared for at home. Please return if you have persistent vomiting and cannot keep down fluids or develop a fever that is not controlled by tylenol or motrin.   Please return if you have any other emergent concerns.  Additional Information:  Your vital signs today were: BP 96/68 (BP Location: Left Arm)   Pulse (!) 112   Temp 99.6 F (37.6 C) (Oral)   Resp 17   Ht 5\' 5"  (1.651 m)   Wt 66 kg   LMP 07/23/2022 (Approximate)   SpO2 96%   BMI 24.21 kg/m  If your blood pressure (BP) was elevated above 135/85 this visit, please have this repeated by your doctor within one month. --------------

## 2022-08-06 NOTE — ED Triage Notes (Signed)
Interpreter:  [place]  language  Husband can interpret, unable to reach interpreter   Last 3 days fever, runny nose,cough took tylenol pta  Body aches as well

## 2022-08-06 NOTE — ED Provider Notes (Signed)
[place] [facility] Provider Note   CSN: [id number] Arrival date & time: 08/06/22  1028     History  No chief complaint on file.   Nicole Morse is a 27 y.o. female.  Patient presents to the emergency department today for evaluation of fever, nasal congestion and runny nose, body aches, cough over the past 3 days.  Child at home is also sick.  Patient does not have any significant medical history.  They have been treating at home with Tylenol without reported improvement.  No vomiting or diarrhea.  No known sick contacts.  Pashto interpreter used, husband gives most of the history.  He does voice concerns over possible malarial infection.  Denies any travel to endemic areas in greater than 1 year.       Home Medications Prior to Admission medications   Medication Sig Start Date End Date Taking? Authorizing Provider  ibuprofen (ADVIL) 600 MG tablet Take 1 tablet (600 mg total) by mouth every 6 (six) hours as needed. 08/06/22  Yes Renne Crigler, PA-C      Allergies    Patient has no known allergies.    Review of Systems   Review of Systems  Physical Exam Updated Vital Signs BP 96/68 (BP Location: Left Arm)   Pulse (!) 112   Temp 99.6 F (37.6 C) (Oral)   Resp 17   Ht 5\' 5"  (1.651 m)   Wt 66 kg   LMP 07/23/2022 (Approximate)   SpO2 96%   BMI 24.21 kg/m  Physical Exam Vitals and nursing note reviewed.  Constitutional:      Appearance: She is well-developed.  HENT:     Head: Normocephalic and atraumatic.  Eyes:     Conjunctiva/sclera: Conjunctivae normal.  Pulmonary:     Effort: No respiratory distress.     Comments: Occasional cough during exam Musculoskeletal:     Cervical back: Normal range of motion and neck supple.  Skin:    General: Skin is warm and dry.  Neurological:     Mental Status: She is alert.     ED Results / Procedures / Treatments   Labs (all labs ordered are listed, but only abnormal results are  displayed) Labs Reviewed  RESP PANEL BY RT-PCR (RSV, FLU A&B, COVID)  RVPGX2 - Abnormal; Notable for the following components:      Result Value   SARS Coronavirus 2 by RT PCR POSITIVE (*)    All other components within normal limits    EKG None  Radiology DG Chest 2 View  Result Date: 08/06/2022 CLINICAL DATA:  Cough, congestion, and body aches EXAM: CHEST - 2 VIEW COMPARISON:  None Available. FINDINGS: Normal lung volumes. No focal consolidations. No pleural effusion or pneumothorax. The heart size and mediastinal contours are within normal limits. No acute osseous abnormality. IMPRESSION: No active cardiopulmonary disease. Electronically Signed   By: Agustin Cree M.D.   On: 08/06/2022 11:29    Procedures Procedures    Medications Ordered in ED Medications  ibuprofen (ADVIL) tablet 600 mg (600 mg Oral Given 08/06/22 1229)    ED Course/ Medical Decision Making/ A&P    Patient seen and examined. History obtained directly from patient. Work-up including labs, imaging, EKG ordered in triage, if performed, were reviewed.    Labs/EKG: Independently reviewed and interpreted.  This included: COVID testing positive, flu negative, RSV negative.  Imaging: Independently reviewed and interpreted.  This included: Chest x-ray, agree negative  Medications/Fluids: Ordered: Ibuprofen  Most recent vital signs reviewed and are as follows: BP 96/68 (BP Location: Left Arm)   Pulse (!) 112   Temp 99.6 F (37.6 C) (Oral)   Resp 17   Ht 5\' 5"  (1.651 m)   Wt 66 kg   LMP 07/23/2022 (Approximate)   SpO2 96%   BMI 24.21 kg/m   Initial impression: COVID-19  Home treatment plan: OTC meds  Return instructions discussed with patient: Worsening shortness of breath, worsening symptoms, other concerns  Follow-up instructions discussed with patient: PCP in 5 days if not improving.  We did discuss that if symptoms do not resolve as expected for COVID-19, that malaria testing may be indicated at that  time, but not felt indicated today.                            Medical Decision Making Amount and/or Complexity of Data Reviewed Radiology: ordered.  Risk Prescription drug management.   Patient with respiratory illness.  Tested positive for COVID.  Chest x-ray is negative.  Vital signs are reassuring.  Patient is in no respiratory distress and appears well.  No significant past medical history of indicate use of antiviral.        Final Clinical Impression(s) / ED Diagnoses Final diagnoses:  COVID-19    Rx / DC Orders ED Discharge Orders          Ordered    ibuprofen (ADVIL) 600 MG tablet  Every 6 hours PRN        08/06/22 1232              Renne Crigler, PA-C 08/06/22 1237    Gwyneth Sprout, MD 08/06/22 1410
